# Patient Record
Sex: Female | Born: 1980 | Race: White | Hispanic: No | Marital: Married
Health system: Midwestern US, Community
[De-identification: ages and names within clinical notes are randomized; demographics above are authoritative.]

## PROBLEM LIST (undated history)

## (undated) DIAGNOSIS — F419 Anxiety disorder, unspecified: Secondary | ICD-10-CM

## (undated) DIAGNOSIS — D649 Anemia, unspecified: Secondary | ICD-10-CM

## (undated) DIAGNOSIS — B2790 Infectious mononucleosis, unspecified without complication: Secondary | ICD-10-CM

## (undated) DIAGNOSIS — K769 Liver disease, unspecified: Secondary | ICD-10-CM

## (undated) HISTORY — DX: Anemia, unspecified: D64.9

## (undated) HISTORY — PX: BREAST BIOPSY: SHX20

## (undated) HISTORY — DX: Infectious mononucleosis, unspecified without complication: B27.90

## (undated) HISTORY — DX: Anxiety disorder, unspecified: F41.9

---

## 2007-04-22 LAB — TSH 3RD GENERATION: TSH, External: 1.404

## 2007-04-22 LAB — HEP B SURFACE AG: HBsAg, External: NEGATIVE

## 2007-04-22 LAB — RUBELLA AB, IGM: Rubella, External: IMMUNE

## 2007-04-22 LAB — HEMOGLOBIN: Hgb, External: 10.8

## 2007-04-22 LAB — N GONORRHOEAE, DNA PROBE: Gonorrhea, External: NEGATIVE

## 2007-04-22 LAB — CHLAMYDIA DNA PROBE: Chlamydia, External: NEGATIVE

## 2007-04-22 LAB — RPR
RPR, EXTERNAL: NONREACTIVE
RPR, External: NONREACTIVE

## 2007-04-22 LAB — URINALYSIS W/O MICRO: Urine, External: NEGATIVE

## 2007-04-22 LAB — ANTIBODY SCREEN: Antibody screen, External: NEGATIVE

## 2007-04-22 LAB — BLOOD TYPE, (ABO+RH)
ABO,Rh: O POS
TYPE, ABO & RH, EXTERNAL: O POS

## 2007-04-22 LAB — HIV 1 WESTERN BLOT
HIV, EXTERNAL: NONREACTIVE
HIV, External: NONREACTIVE

## 2007-04-22 LAB — CYSTIC FIBROSIS MUTATIONS
CYSTIC FIBROSIS, EXTERNAL: NEGATIVE
Cystic Fibrosis, External: NEGATIVE

## 2007-04-22 LAB — HEMATOCRIT: Hct, External: 31.1

## 2007-09-02 LAB — HEMATOCRIT: Hct, External: 34.2

## 2007-09-02 LAB — HEMOGLOBIN: Hgb, External: 11.9

## 2007-09-02 LAB — GLUCOSE, RANDOM: Glucose: 146

## 2007-10-26 LAB — GYN RAPID GP B STREP: GrBStrep, External: NEGATIVE

## 2007-11-17 NOTE — Progress Notes (Signed)
Pt here co abd pain and leaking fluid nitrazine neg x 2

## 2007-11-17 NOTE — Progress Notes (Signed)
nitrazine neg after 30 min of walking ve done 1 cm posterior mild uc noted dc instructions given states understanding off efm home with family stabel

## 2007-11-18 ENCOUNTER — Inpatient Hospital Stay
Admit: 2007-11-18 | Discharge: 2007-11-20 | Disposition: A | Discharge status: Home / Self Care | Source: Home / Self Care | Attending: Obstetrics & Gynecology | Admitting: Obstetrics & Gynecology

## 2007-11-18 MED ADMIN — meperidine (DEMEROL) injection 75 mg: INTRAMUSCULAR | @ 16:00:00 | NDC 55175500101

## 2007-11-18 MED ADMIN — promethazine (PHENERGAN) injection 25 mg: INTRAVENOUS | @ 16:00:00 | NDC 60977000143

## 2007-11-18 MED ADMIN — ibuprofen (MOTRIN) tablet 400 mg: ORAL | @ 19:00:00 | NDC 76420057590

## 2007-11-18 MED ADMIN — hydrocortisone-pramoxine (EPIFOAM) 1-1 % topical foam: TOPICAL | @ 19:00:00 | NDC 68220014410

## 2007-11-18 MED ADMIN — hydrocodone-acetaminophen (LORTAB) 5-500 mg per tablet 1 Tab: ORAL | NDC 63481009170

## 2007-11-18 MED ADMIN — lidocaine (XYLOCAINE) 2 % jelly: TOPICAL | @ 16:00:00 | NDC 63323047930

## 2007-11-18 MED ADMIN — witch hazel-glycerin (TUCKS) 12.5-50 % pads 1 Each: @ 19:00:00 | NDC 50289420001

## 2007-11-18 MED ADMIN — ibuprofen (MOTRIN) tablet 400 mg: ORAL | NDC 76420057590

## 2007-11-18 MED ADMIN — hydrocodone-acetaminophen (LORTAB) 5-500 mg per tablet 1 Tab: ORAL | @ 19:00:00 | NDC 63481009170

## 2007-11-18 MED ADMIN — docusate sodium (COLACE) capsule 100 mg: ORAL | @ 19:00:00 | NDC 57664011208

## 2007-11-18 MED ADMIN — lidocaine (XYLOCAINE) 10 mg/mL (1 %) injection: @ 16:00:00 | NDC 54569319800

## 2007-11-18 MED ADMIN — oxytocin (PITOCIN) 20 units/1000 ml LR: INTRAVENOUS | @ 16:00:00 | NDC 53191040901

## 2007-11-18 NOTE — Procedures (Signed)
Delivery Note    Obstetrician:  Linden Dolin. Kylan Liberati, MD    Assistant: none    Pre-Delivery Diagnosis: Term pregnancy    Post-Delivery Diagnosis: Living newborn infant(s) or Female    Intrapartum Event: None    Procedure: Spontaneous vaginal delivery    Epidural: NO    Monitor:  Fetal Heart Tones - External and Uterine Contractions - External    Indications for instrumental delivery: none    Estimated Blood Loss: 300cc    Episiotomy: 2nd degree    Laceration(s):  n/a    Laceration(s) repair: NO    Presentation: Cephalic    Fetal Description: singleton    Fetal Position: Occiput Anterior    Birth Weight: 7 lbs 3 oz    Apgar - One Minute: 8    Apgar - Five Minutes: 9    Umbilical Cord: 3 vessels present    Complications:  none           Cord Blood Results:   Information for the patient's newborn:  Adrian Prince Boy [[id number]]   No results found for this basename: PCTABR,PCTDIG,BILI       Prenatal Labs:   Lab Results   Component Value Date/Time   ??? ABO,Rh O positive 04/22/07   ??? Antibody screen negative 04/22/07   ??? HBsAg negative 04/22/07   ??? HIV non reactive 04/22/07   ??? Rubella immune 04/22/07   ??? RPR non reactive 04/22/07   ??? Gonorrhea negative 04/22/07   ??? Chlamydia negative 04/22/07   ??? GrBS negative 10/26/07          Attending Attestation: I was present and scrubbed for the entire procedure    Signed By:  Linden Dolin. Xareni Kelch, MD     November 18, 2007

## 2007-11-18 NOTE — H&P (Signed)
Pregnancy normal per patient history.  Beta strep negative.  Previous delivery following uncomplicated pregnancy five years ago.  Ruptured at 9 am this morning.  Transported to [facility] via EMS.  Completely dilated on admission and plus one station.  Fetal heart tones reactive.  Patient proceeded to second stage as described in delivery note.

## 2007-11-18 NOTE — Progress Notes (Signed)
Pt resting in bed. Encouraged to call with needs.

## 2007-11-18 NOTE — Progress Notes (Signed)
Dr Farrell Ours at bedside.  Strip reviewed.  SVE c/100/+2  Orders to set up for delivery and start pushing.  Report received and care assumed.

## 2007-11-18 NOTE — Progress Notes (Signed)
TRANSFER - OUT REPORT:    Verbal report given to S. Balliew, RN(name) on Mary Hampton  being transferred to MIU(unit) for routine progression of care       Report consisted of patient???s Situation, Background, Assessment and   Recommendations(SBAR).     Information from the following report(s) Kardex, Procedure Summary, Intake/Output and MAR was reveiwed with the receiving nurse.    Opportunity for questions and clarification was provided.

## 2007-11-18 NOTE — Progress Notes (Signed)
SVD Viable female apgars 8-9 wt 8-3

## 2007-11-18 NOTE — Progress Notes (Signed)
Pt set up for delivery

## 2007-11-18 NOTE — Progress Notes (Signed)
Pt up to bathroom with assist from nurse. Peri care taught. Pt verbalized understanding. Pt voided large amount. Encouraged to call with needs.

## 2007-11-18 NOTE — Progress Notes (Signed)
TRANSFER - IN REPORT:    Verbal report received from Jerelene Redden, RN on Mary Hampton  being received from LDR(unit) for routine progression of care      Report consisted of patient???s Situation, Background, Assessment and   Recommendations(SBAR).     Information from the following report(s) Kardex was reveiwed with the receiving nurse.    Opportunity for questions and clarification was provided.      Assessment completed upon patient???s arrival to unit and care assumed.

## 2007-11-18 NOTE — Procedures (Signed)
Delivery Note    Obstetrician:  Ahmira Boisselle A. Janautica Netzley, MD    Assistant: none    Pre-Delivery Diagnosis: Term pregnancy    Post-Delivery Diagnosis: Living newborn infant(s) or Female    Intrapartum Event: None    Procedure: Spontaneous vaginal delivery    Epidural: NO    Monitor:  Fetal Heart Tones - External and Uterine Contractions - External    Indications for instrumental delivery: none    Estimated Blood Loss: 300cc    Episiotomy: 2nd degree    Laceration(s):  n/a    Laceration(s) repair: NO    Presentation: Cephalic    Fetal Description: singleton    Fetal Position: Occiput Anterior    Birth Weight: 7 lbs 3 oz    Apgar - One Minute: 8    Apgar - Five Minutes: 9    Umbilical Cord: 3 vessels present    Complications:  none           Cord Blood Results:   Information for the patient's newborn:  Byerly, Baby Boy [[id number]]   No results found for this basename: PCTABR,PCTDIG,BILI       Prenatal Labs:   Lab Results   Component Value Date/Time   ??? ABO,Rh O positive 04/22/07   ??? Antibody screen negative 04/22/07   ??? HBsAg negative 04/22/07   ??? HIV non reactive 04/22/07   ??? Rubella immune 04/22/07   ??? RPR non reactive 04/22/07   ??? Gonorrhea negative 04/22/07   ??? Chlamydia negative 04/22/07   ??? GrBS negative 10/26/07          Attending Attestation: I was present and scrubbed for the entire procedure    Signed By:  Mariateresa Batra A. Deanthony Maull, MD     November 18, 2007

## 2007-11-19 MED ADMIN — ibuprofen (MOTRIN) tablet 400 mg: ORAL | @ 21:00:00 | NDC 76420057590

## 2007-11-19 MED ADMIN — ibuprofen (MOTRIN) tablet 400 mg: ORAL | @ 16:00:00 | NDC 76420057590

## 2007-11-19 MED ADMIN — docusate sodium (COLACE) capsule 100 mg: ORAL | @ 12:00:00 | NDC 57664011208

## 2007-11-19 MED ADMIN — hydrocodone-acetaminophen (LORTAB) 5-500 mg per tablet 1 Tab: ORAL | @ 08:00:00 | NDC 63481009170

## 2007-11-19 MED ADMIN — hydrocodone-acetaminophen (LORTAB) 5-500 mg per tablet 1 Tab: ORAL | @ 04:00:00 | NDC 63481009170

## 2007-11-19 MED ADMIN — hydrocodone-acetaminophen (LORTAB) 5-500 mg per tablet 1 Tab: ORAL | @ 16:00:00 | NDC 63481009170

## 2007-11-19 MED ADMIN — docusate sodium (COLACE) capsule 100 mg: ORAL | @ 21:00:00 | NDC 57664011208

## 2007-11-19 MED ADMIN — ibuprofen (MOTRIN) tablet 400 mg: ORAL | @ 12:00:00 | NDC 76420057590

## 2007-11-19 MED ADMIN — hydrocortisone-pramoxine (EPIFOAM) 1-1 % topical foam: TOPICAL | @ 09:00:00 | NDC 68220014410

## 2007-11-19 MED ADMIN — ibuprofen (MOTRIN) tablet 400 mg: ORAL | @ 08:00:00 | NDC 76420057590

## 2007-11-19 MED ADMIN — hydrocodone-acetaminophen (LORTAB) 5-500 mg per tablet 1 Tab: ORAL | @ 21:00:00 | NDC 63481009170

## 2007-11-19 MED ADMIN — witch hazel-glycerin (TUCKS) 12.5-50 % pads 1 Each: @ 21:00:00 | NDC 50289420001

## 2007-11-19 MED ADMIN — hydrocodone-acetaminophen (LORTAB) 5-500 mg per tablet 1 Tab: ORAL | @ 12:00:00 | NDC 63481009170

## 2007-11-19 MED ADMIN — ibuprofen (MOTRIN) tablet 400 mg: ORAL | @ 04:00:00 | NDC 76420057590

## 2007-11-19 NOTE — Progress Notes (Signed)
Baby did not latch onto right breast, just squirmed and fussed. Mom switched to right side and baby nursed for 10 minutes well, good swallows noted. Mom pumped for stimulation. Grandmother put pacifier in baby's mouth. After baby took the 1 ml, he still acted hungry. Baby then took 18 ml's from SNS at right breast. Feeding plan given.

## 2007-11-19 NOTE — Progress Notes (Signed)
Report received from [facility]; assumed pt. Cared. Bed-side report attempted, pt. Sleeping.

## 2007-11-20 MED ADMIN — benzocaine-lanolin-aloe vera (DERMOPLAST/ALOE/LANOLIN) 20 % topical aerosol 1 Spray: TOPICAL | @ 13:00:00 | NDC 72934540402

## 2007-11-20 MED ADMIN — hydrocodone-acetaminophen (LORTAB) 5-500 mg per tablet 1 Tab: ORAL | @ 10:00:00 | NDC 63481009170

## 2007-11-20 MED ADMIN — ibuprofen (MOTRIN) tablet 400 mg: ORAL | @ 10:00:00 | NDC 76420057590

## 2007-11-20 MED ADMIN — hydrocodone-acetaminophen (LORTAB) 5-500 mg per tablet 1 Tab: ORAL | @ 15:00:00 | NDC 63481009170

## 2007-11-20 MED ADMIN — ibuprofen (MOTRIN) tablet 400 mg: ORAL | @ 15:00:00 | NDC 76420057590

## 2007-11-20 MED ADMIN — ibuprofen (MOTRIN) tablet 400 mg: ORAL | @ 01:00:00 | NDC 76420057590

## 2007-11-20 MED ADMIN — hydrocodone-acetaminophen (LORTAB) 5-500 mg per tablet 1 Tab: ORAL | @ 21:00:00 | NDC 63481009170

## 2007-11-20 MED ADMIN — docusate sodium (COLACE) capsule 100 mg: ORAL | @ 13:00:00 | NDC 57664011208

## 2007-11-20 MED ADMIN — hydrocodone-acetaminophen (LORTAB) 5-500 mg per tablet 1 Tab: ORAL | @ 01:00:00 | NDC 63481009170

## 2007-11-20 MED ADMIN — ibuprofen (MOTRIN) tablet 400 mg: ORAL | @ 21:00:00 | NDC 76420057590

## 2007-11-20 NOTE — Progress Notes (Signed)
Massage Therapy Note   Stopped by to check on giving massage.  Mother nursing, requested return in 30 min.    Kristin Bruins, LMBT

## 2007-11-20 NOTE — H&P (Addendum)
Note for Neema Barreira on 04/22/2007 - Chart 8336      Chief Complaint: This 27 year old female presents today for an initial obstetrical examination. LMP 02/13/07. Had problems with bleeding after intercourse approx 2 weeks ago. EDC by [place] 11/22/07. Associated signs and symptoms include abdominal pain in lower abdomen with coughing last night.    LMP: 04/22/2007 EDD: 11/21/2007 GW: 9 weeks, 4 days.   Obstetrical History: Apolinar Junes 08/07/1999 Method: vaginal Labor: 20 hours Duration: 38 weeks Gender: female Weight: 6 lbs. 11 ozs. Birth Status: alive Circumcised: yes Complications: hypertension       Allergies: Patient admits allergies to none known.    Medication History: Active: prenatal vitamins (OTC) (active).    Past Medical History: Past medical history is unremarkable.    Past Surgical History: Patient admits past surgical history of abortion, nasal surgery in 2001.    Social History: Patient denies alcohol use, Patient denies illegal drug use, Patient denies STD history, Patient denies tobacco use, Patient denies caffeine use, Patient denies exercising regularly.    Family History: Unremarkable.    Review of Systems: Unremarkable with exception of chief complaint.     Physical Exam: BP Sitting: 102/60 Height: 5 ft. 5.000 in. Weight: 153 lbs. BMI: 25   Patient is a 27 year old female who appears pleasant, in no apparent distress, her given age, well developed, well nourished and with good attention to hygiene and body habitus. Oriented to person, place and time. Mood and affect normal and appropriate to situation.    HEENT:Head & Face: Examination of head and face is unremarkable  Skin: No skin rash, subcutaneous nodules, lesions or ulcers observed.   No edema observed.   Cardiovascular: Heart auscultation reveals no murmurs, gallop, rubs or clicks.   Respiratory: Lungs CTA.   Breast: Chest (Breasts): Breast inspection and palpation shows no abnormal findings   Abdomen: Abdomen soft, nontender, bowel sounds present x 4 without palpable masses.   Genitourinary: External genitalia are normal in appearance.   Examination of urethra shows no abnormallities.   Examination of vaginal vault reveals no abnormalities.   Cervix shows no pathology.   Uterine portion of bimanual exam reveals contour normal, shape regular and size normal. Adnexa and parametria show no masses, tenderness, organomegaly or nodularity.   Examination of anus and perineum shows no abnormalities.     Test Results:     Ultrasound Exam: Type Of Ultrasound: OB Transvaginal.  Fetal number is 1. Fetal activity noted:  cardiac activity and body movement.  Report and images in ViewPoint.  U/S performed by [facility].      Impression: normal subsequent.      Plan: Pap smear submitted for manual screening.    Patient to follow-up as scheduled. Pregnancy schedule of monthly/bimonthly/weekly.       Prescriptions:  Rx: CitraNatal Assure- vitamin+300 mg DHA vitamin and capsule , 1 once a day. Max daily dose: 1.  Dispense: 90 of each pill . Refills: 3.  Allow Generic: Yes     Signed by _______________________________ Suszanne Conners. Mikey Bussing, M.D.  Digital Signature on 04/22/2007 at 12:55:43 PM by: Suszanne Conners. Mikey Bussing, M.D.Suszanne Conners. Mikey Bussing, MD  11/20/2007  7:35 AM There have been no significant changes since the patient's last prenatal visit. Suszanne Conners. Mikey Bussing, MD  11/20/2007  11:29 PM

## 2007-11-20 NOTE — Undefined (Incomplete)
Post-Partum Day Number 2 Progress/Discharge Note    Patient doing well post-partum and today complains of abdominal pain.  Voiding without difficulty, normal lochia, positive flatus.    Vitals:  Patient Vitals in the past 8 hrs:   BP Temp Temp src Pulse Resp SpO2 Height Wt - Scale   11/20/07 0830 114/64 mmHg 97.5 ??F (36.4 ??C) - 74  18  - - -       Temp (24hrs), Avg:97.5 ??F (36.4 ??C), Min:97.5 ??F (36.4 ??C), Max:97.5 ??F (36.4 ??C)      Vital signs stable, afebrile.    Exam:  Patient without distress.               Abdomen soft, fundus firm at level of umbilicus, nontender               Perineum with normal lochia noted.               Lower extremities are negative for swelling, cords or tenderness.    Labs: No results found for this or any previous visit (from the past 24 hour(s)).    Assessment and Plan:  Patient appears to be having uncomplicated post-partum course. Orderedd  Continue routine perineal care and maternal education.  Plan discharge for today with follow up in our office in 6 weeks.

## 2007-11-20 NOTE — Progress Notes (Signed)
Mom states baby has continued to BF well, has been using the SNS for most fds and pumping. Enc mom to continue to use SNS as needed but to pump as well when utilizing SNS. Mom states understanding. Mom rented pump for d/c. Reviewed engorgement precautions, handout given. Enc freq fdg and watch output closely.

## 2007-11-20 NOTE — Progress Notes (Signed)
md notified verbal orders received to d/c home.  Instructed by Dr. Farrell Ours to get number to pharmacy and she would call into pharmacy

## 2007-11-20 NOTE — Progress Notes (Signed)
Note for Mary Hampton on 11/11/2007 - Chart 8336      Subjective: This 27 year old female presents today for a prenatal follow-up.  The patient indicates fetal activity is present. LMP: 04/22/2007 EDD: 11/21/2007 GW: 38 weeks, 4 days. Review of Systems: Musculoskeletal: (+) joint or musculoskeletal symptoms, Allergic / Immunologic: (-) allergic or immunologic symptoms, Cardiovascular: (-) cardiovascular problems or chest symptoms, Constitutional Symptoms: (-) constitutional symptoms such as fever, headache, nausea, dizziness, Ears, Nose, Mouth, Throat: (-) symptoms involving ear, nose, mouth, or throat, Endocrine: (-) endocrine-related symptoms, Eyes: (-) eye or vision problems, Gastrointestinal: (-) GI symptoms, Genitourinary: (-) GU symptoms, Hematologic / Lymphatic: (-) bleeding problems, Integumentary: (-) hypersensitivity of skin, Neurological: (-) neurological symptoms or problems, Psychiatric: (-) psychiatric or emotional difficulties, Respiratory: (-) breathing difficulties, respiratory symptoms.      Objective: Patient is a 27 year old female who appears pleasant, in no apparent distress, her given age, well developed, well nourished and with good attention to hygiene and body habitus.      Test Results:  Prenatal Visit - Glucosuria: negative; Proteinuria: trace; Edema: absent; Mvmt: present; FHR: 149 bpm ; Hb: NT g/dl ; Wt: 187.25 lbs ; FHt: 38 cm ; BP: 108/70; Remarks: C/o swelling in feet. Pt [facility] different [facility] at [facility]. ;      Assessment: Patient is pregnant and Urine Dip Complete.      Plan: Return for previously scheduled appointment..       Signed by _______________________________ Suszanne Conners. Mikey Bussing, M.D.  Digital Signature on 11/11/2007 at 1:22:57 PM by: Suszanne Conners. Mikey Bussing, M.D.

## 2007-11-20 NOTE — Progress Notes (Signed)
[facility]      [address] [place], [place] [address]  [phone]    MASSAGE THERAPY NOTE  []   Initial Assessment    []   Daily Note    NAME/AGE/GENDER: Particia Strahm is a 27 y.o. female  DATE: 11/20/2007 10:29 AM    Primary Diagnosis:  <principal problem not specified> * No surgery found *    Referring Provider: Mikey Bussing    PRIOR LEVEL OF FUNCTION: Healthy    No past medical history on file.  No past surgical history on file.    COGNITIVE / COMMUNICATION STATUS:  fully communicative    SUBJECTIVE:    Medical History Related to Therapy: Delivered baby    Patient reports: Arm/back pain    Pain/Discomfort level:   [] 0 [] 1 [] 2 [] 3 [] 4 [] 5 [] 6 [x] 7 [] 8 [] 9 [] 10      Location: arms/shoulders/back, on bed in room    OBJECTIVE:    Significant findings: Tight shoulders, LS, Traps, biceps, triceps.  Pt expressed "ouch" when pressure applied to low back, right side    Treatment Provided:  [x] Soft tissue massage [] Deep tissue massage [] Trigger point release   [] Light Touch Massage  [] Other:     Location: back, arms, neck shoulders    Patient position: seated on edge of bed    Time: 15 minutes    Other information/comments/patient education: Pt had a deep tissue massage prior. Didn't like it    RECOMMENDATIONS / TREATMENT PLAN:    ASSESSMENT:  Response to treatment: reported no pain in arms after massage    Pain/Discomfort level:  [x] 0 [] 1[]  2 [] 3 [] 4[]  5 [] 6 [] 7[]  8[]  9 [] 10        Location: arms, shoulders, neck, back    PLAN/INTENT FOR NEXT TREATMENT:  Order is for a one-time visit only.  No further therapy is planned.    Kristin Bruins, LMBT

## 2007-11-20 NOTE — Progress Notes (Signed)
Discharge teachng complete.Pt verbalized understanding, questions encouraged..  Pt. Stable and discharged to home per MD order. Pt. Left unit via w/c with family , newborn in carseat the patient. And newborn escorted off unit by MIU staff. Pt. To home via private automobile.

## 2007-11-20 NOTE — Progress Notes (Signed)
Note for Mary Hampton on 10/22/2007 - Chart 8336      This 27 year old female presents today for an Ultrasound to follow up position.   Type of diagnostic ultrasound performed: limited, for position.  Type Of Ultrasound: OB Ltd. (Position, Plac Location or heart tones).  Reasons for OB ultrasound: Position only.     Ultrasounds -   Gestational Age by Ultrasound: ---- weeks  Gestational Age by Date: 36 weeks  Remarks: AFI is within normal limits Vertex position     Limited scan for position only.  Vertex position.  AFI 10.6 cm.  Good movements detected.      Reason for OB Ultrasound:  Breech.      Ultrasound performed by Everlean Alstrom, RDMS.   Report and Images in Viewpoint.     Signed by _______________________________ Suszanne Conners. Mikey Bussing, M.D.

## 2007-11-20 NOTE — Progress Notes (Signed)
Note for Mary Hampton on 07/06/2007 - Chart 8336      This 27 year old female presents today for a fetal ultrasound.  Type of diagnostic ultrasound performed: Ultrasound,pregnant uterus,detailed fetal anatomic exam,transabdominal,singleton.   Reason for OB Ultrasound:  Screening for malformation.      Ultrasounds -   Gestational Age by Ultrasound: [redacted]w[redacted]d weeks  Gestational Age by Date: 20 weeks  Remarks: Agrees with EDD Appropriate interval growth boy Do not tell sex of baby!     Anatomy and growth are appropriate for this gestional age.   No abnormalities are seen at this time.  Do not tell sex of baby!     Reason for OB Ultrasound:  Screening for malformation.        Ultrasound performed by Everlean Alstrom, RDMS.  Report and images in ViewPoint.           Signed by _______________________________ Suszanne Conners. Mikey Bussing, M.D.

## 2015-08-21 ENCOUNTER — Encounter

## 2015-09-01 ENCOUNTER — Inpatient Hospital Stay: Admit: 2015-09-01 | Payer: PRIVATE HEALTH INSURANCE | Attending: Family | Primary: Family Medicine

## 2015-09-01 ENCOUNTER — Inpatient Hospital Stay
Admit: 2015-09-01 | Discharge: 2015-09-01 | Disposition: A | Discharge status: Home / Self Care | Payer: PRIVATE HEALTH INSURANCE | Attending: Emergency Medicine

## 2015-09-01 DIAGNOSIS — T7840XA Allergy, unspecified, initial encounter: Secondary | ICD-10-CM

## 2015-09-01 DIAGNOSIS — K769 Liver disease, unspecified: Secondary | ICD-10-CM

## 2015-09-01 MED ORDER — FAMOTIDINE (PF) 20 MG/2 ML IV
20 mg/2 mL | INTRAVENOUS | Status: AC
Start: 2015-09-01 — End: 2015-09-01
  Administered 2015-09-01: 13:00:00 via INTRAVENOUS

## 2015-09-01 MED ORDER — DIPHENHYDRAMINE HCL 50 MG/ML IJ SOLN
50 mg/mL | INTRAMUSCULAR | Status: AC
Start: 2015-09-01 — End: 2015-09-01
  Administered 2015-09-01: 13:00:00 via INTRAVENOUS

## 2015-09-01 MED ORDER — SODIUM CHLORIDE 0.9% BOLUS IV
0.9 % | Freq: Once | INTRAVENOUS | Status: AC
Start: 2015-09-01 — End: 2015-09-01
  Administered 2015-09-01: 13:00:00 via INTRAVENOUS

## 2015-09-01 MED ORDER — EPINEPHRINE 0.3 MG/0.3 ML IM PEN INJECTOR
0.3 mg/ mL | INJECTION | Freq: Once | INTRAMUSCULAR | 0 refills | Status: AC | PRN
Start: 2015-09-01 — End: ?

## 2015-09-01 MED ORDER — METHYLPREDNISOLONE (PF) 125 MG/2 ML IJ SOLR
125 mg/2 mL | Freq: Once | INTRAMUSCULAR | Status: AC
Start: 2015-09-01 — End: 2015-09-01
  Administered 2015-09-01: 13:00:00 via INTRAVENOUS

## 2015-09-01 MED ORDER — SALINE PERIPHERAL FLUSH PRN
Freq: Once | INTRAMUSCULAR | Status: AC
Start: 2015-09-01 — End: 2015-09-01
  Administered 2015-09-01: 13:00:00

## 2015-09-01 MED ORDER — PREDNISONE 20 MG TAB
20 mg | ORAL_TABLET | Freq: Every day | ORAL | 0 refills | Status: AC
Start: 2015-09-01 — End: 2015-09-05

## 2015-09-01 MED ORDER — GADOBENATE DIMEGLUMINE 529 MG/ML (0.1 MMOL/0.2 ML) IV
529 mg/mL (0.1mmol/0.2mL) | Freq: Once | INTRAVENOUS | Status: AC
Start: 2015-09-01 — End: 2015-09-01
  Administered 2015-09-01: 13:00:00 via INTRAVENOUS

## 2015-09-01 MED FILL — DIPHENHYDRAMINE HCL 50 MG/ML IJ SOLN: 50 mg/mL | INTRAMUSCULAR | Qty: 1

## 2015-09-01 MED FILL — FAMOTIDINE (PF) 20 MG/2 ML IV: 20 mg/2 mL | INTRAVENOUS | Qty: 2

## 2015-09-01 MED FILL — SOLU-MEDROL (PF) 125 MG/2 ML SOLUTION FOR INJECTION: 125 mg/2 mL | INTRAMUSCULAR | Qty: 2

## 2015-09-01 NOTE — ED Provider Notes (Addendum)
HPI Comments: 29 female here with allergic reaction for outpatient MRI. Symptoms started approximately 15-20 minutes ago.  She received IV contrast and shortly after started to feel itchiness in her back of throat and coughing.  She has had improvement from the cough and standpoint however still had some itchiness in her neck or throat.  Denies any rashes, lips, tongue swelling.  Denies any abdominal pain, nausea.  When she stood up she felt some slight lightheadedness.  Prior to the MRI she was feeling fine has no symptoms or concern    Patient is a 35 y.o. female presenting with allergic reaction. The history is provided by the patient.   Allergic Reaction    Pertinent negatives include no shortness of breath.        History reviewed. No pertinent past medical history.    History reviewed. No pertinent surgical history.      History reviewed. No pertinent family history.    Social History     Social History   ??? Marital status: MARRIED     Spouse name: N/A   ??? Number of children: N/A   ??? Years of education: N/A     Occupational History   ??? Not on file.     Social History Main Topics   ??? Smoking status: Never Smoker   ??? Smokeless tobacco: Not on file   ??? Alcohol use No   ??? Drug use: No   ??? Sexual activity: Yes     Other Topics Concern   ??? Not on file     Social History Narrative         ALLERGIES: Contrast agent [iodine] and Multihance [gadobenate dimeglumine]    Review of Systems   Constitutional: Negative.    HENT: Negative for congestion, rhinorrhea and sore throat.    Eyes: Negative.    Respiratory: Negative.  Negative for cough, chest tightness and shortness of breath.    Cardiovascular: Negative.    Gastrointestinal: Negative.    Genitourinary: Negative.    Musculoskeletal: Negative.    Neurological: Negative.    Psychiatric/Behavioral: Negative.        Vitals:    09/01/15 0849   BP: 114/51   Pulse: 65   Resp: 18   Temp: 98.3 ??F (36.8 ??C)   SpO2: 100%   Weight: 73.5 kg (162 lb)   Height: 5' 4.5" (1.638 m)             Physical Exam   Constitutional: She is oriented to person, place, and time. She appears well-developed and well-nourished. No distress.   HENT:   Head: Normocephalic and atraumatic.   Nose: Nose normal.   Mouth/Throat: Oropharynx is clear and moist.   No lives edema, lingual, sublingual swelling.  No posterior pharynx edema   Eyes: Conjunctivae and EOM are normal. Pupils are equal, round, and reactive to light.   Neck: Normal range of motion. Neck supple.   Cardiovascular: Normal rate, regular rhythm, normal heart sounds and intact distal pulses.    Pulmonary/Chest: Effort normal and breath sounds normal. No respiratory distress. She has no wheezes. She has no rales. She exhibits no tenderness.   Abdominal: Soft. Bowel sounds are normal. She exhibits no distension. There is no tenderness. There is no rebound.   Musculoskeletal: Normal range of motion. She exhibits no edema or deformity.   Neurological: She is alert and oriented to person, place, and time.   Skin: Skin is warm and dry. She is not diaphoretic.  MDM  Number of Diagnoses or Management Options  Diagnosis management comments: Clinically no rash no signs of any airway compromise however she still has some complaint of mild itchiness in the back of throat.  We'll initiate treatment for allergic reaction with Solu-Medrol, Pepcid, Benadryl.  We'll also give IV fluid.  Symptoms started at approximately 8:15 am. Attempts are 2 patient for at least one hour until approximately 10 AM and reassessed.    Spoke with MRI tech - Contrast at approximately [phone]. Patient started coughing and complained of nausea. She felt better shortly after and wanted to finish the MRI. They finished. Patient felt better but still has coughing and itchiness in the back of her throat so he brought her to the ED for evaluation.         ED Course   [phone] - Patient is feeling well. Speaking without difficulty.   Will reassess      1020 - Feels better.   Itchiness in back of throat mostly resolved.  Lungs are clear. No wheezing. No stridors.  No posterior pharynx edema. We'll give prescription for Adrenaclick and prednisone for 4 additional days. Benadryl and pepcid at home. Return precautions given. No further questions or concerns     Dragon voice recognition software was used to create this note. Although the note has been reviewed and corrected where necessary, additional errors may have been overlooked and remain in the text.  Procedures

## 2015-09-01 NOTE — ED Notes (Signed)
Patient given ice chips and water.

## 2015-09-01 NOTE — ED Notes (Signed)
Pt discharged home with rx x2 (EpiPen and prednisone) and instructions on when to call for help, when to use epipen and who to follow up with. Verbal understanding of all instructions by patient. Questions answered prior to discharge. All belongings taken with pt. Pt ambulatory out of department w/o difficulty. Accompanied home by son. Pt states she is not dizzy and is able to drive.

## 2015-09-01 NOTE — ED Triage Notes (Signed)
Patient came form MRI after having nausea and congestion with the IV contrast. Patient feels like her throat is scratchy.

## 2018-12-22 ENCOUNTER — Encounter: Payer: Self-pay | Admitting: Gastroenterology

## 2019-01-11 ENCOUNTER — Ambulatory Visit: Payer: Self-pay | Admitting: Gastroenterology

## 2019-02-04 ENCOUNTER — Other Ambulatory Visit: Payer: Self-pay

## 2019-02-04 ENCOUNTER — Encounter: Payer: Self-pay | Admitting: Gastroenterology

## 2019-02-04 ENCOUNTER — Ambulatory Visit: Payer: Medicaid Other | Admitting: Gastroenterology

## 2019-02-04 VITALS — BP 106/74 | HR 70 | Temp 98.6°F | Ht 65.0 in | Wt 180.5 lb

## 2019-02-04 DIAGNOSIS — K219 Gastro-esophageal reflux disease without esophagitis: Secondary | ICD-10-CM

## 2019-02-04 DIAGNOSIS — K449 Diaphragmatic hernia without obstruction or gangrene: Secondary | ICD-10-CM

## 2019-02-04 DIAGNOSIS — R1011 Right upper quadrant pain: Secondary | ICD-10-CM

## 2019-02-04 MED ORDER — PANTOPRAZOLE SODIUM 40 MG PO TBEC: 40.0000 mg | DELAYED_RELEASE_TABLET | Freq: Every day | ORAL | 0 refills | Status: DC

## 2019-02-04 NOTE — Patient Instructions (Signed)
If you are age 40 or older, your body mass index should be between 23-30. Your Body mass index is 30.04 kg/m. If this is out of the aforementioned range listed, please consider follow up with your Primary Care Provider.  If you are age 46 or younger, your body mass index should be between 19-25. Your Body mass index is 30.04 kg/m. If this is out of the aformentioned range listed, please consider follow up with your Primary Care Provider.   You have been scheduled for an abdominal ultrasound at [facility] (1st floor) on 02/10/19 at 9am. Please arrive 15 minutes prior to your appointment for registration. Make certain not to have anything to eat or drink 6 hours prior to your appointment. Should you need to reschedule your appointment, please contact radiology at [phone]. This test typically takes about 30 minutes to perform.  Please go to the lab at [facility] ([address] [place] You will need to go to level "B", you do not need an appointment for this. Hours available are 7:30 am - 4:30 pm.   Please call Dr. Donne Hazel nurse Veronia Beets, RN)  in 2 weeks at [phone]  to let her now how you are doing.   Avoid NSAIDS  Follow up 12 weeks.   Thank you,  Dr. Lynann Bologna

## 2019-02-04 NOTE — Progress Notes (Signed)
Chief Complaint: Right upper quad abdominal pain.  Referring Provider:  No ref. provider found      ASSESSMENT AND PLAN;   #1. RUQ Pain. Epigastric pain. D/d PUD, GERD, gastritis, nonulcer dyspepsia, gastroparesis, musculoskeletal etiology, r/o biliary or pancreatic problems.  #2. GERD with HH  #3. Small right liver hemangioma.  Plan: - [place] complete - CBC, CMP, lipase, celiac and TSH - Trial of protonix 40mg  po qd x 2 weeks. - Call in 2 weeks - If still with problems, consider EGD. - Avoid NSAIDS - FU in 12 weeks.  HPI:    Aimee Escobar is a 39 y.o. female  RUQ pain x 2 months, has nausea but no vomiting.  Does have heartburn.  No odynophagia or dysphagia.  Describes this pain as more or less sharp, nonradiating, occasionally exacerbated after eating.  Could not identify any definite foods which make it worse.  Occasional chest pains.  No weight loss, melena or hematochezia.  occ constipation, better with increased water intake.  No jaundice dark urine or pale stools.  Has previously been diagnosed as having "hiatal hernia".  I could not find any documentation.  She never had EGD done as she is petrified regarding anesthesia.  No sodas, chocolates, chewing gums, artificial sweeteners and candy. No NSAIDs  Past GI history and work-up: [facility] 2017 -Had right upper quad abdominal pain 2017. 2018 showed 10 mm lesion in the liver. CT 06/2015 showed 8 mm lesion in the right lobe of the liver.  Subsequent MRI 09/01/2015 showed small hemangioma.  Patient did get sick after IV MRI  contrast.  But no skin rash/shortness of breath.  HIDA with EF: 35% ejection fraction.  Had normal CBC, CMP, TSH 2017.  Offered EGD but refused.  SH: Separated, 2 boys, no alcohol. Past Medical History:  Diagnosis Date  . Anemia   . Anxiety   . 2018 virus infection   . Mononucleosis      Family History  Problem Relation Age of Onset  . Pancreatic cancer Father   .  Colon cancer Neg Hx   . Esophageal cancer Neg Hx     Social History   Tobacco Use  . Smoking status: Former Malachi Carl  . Smokeless tobacco: Never Used  Substance Use Topics  . Alcohol use: Not Currently  . Drug use: Not Currently    Current Outpatient Medications  Medication Sig Dispense Refill  . Ascorbic Acid (VITAMIN C PO) Take by mouth.    . clonazePAM (KLONOPIN) 0.5 MG tablet Take 1 tablet by mouth as needed.    . Cyanocobalamin (VITAMIN B-12 PO) Take by mouth as needed.    . Ferrous Sulfate (IRON PO) Take by mouth as needed.    Games developer MILK THISTLE PO Take 1 tablet by mouth daily.    . Multiple Vitamins-Minerals (ZINC PO) Take by mouth daily. 11 pumps daily    . Vitamin D-Vitamin K (VITAMIN K2-VITAMIN D3 PO) Take by mouth.     No current facility-administered medications for this visit.    Allergies  Allergen Reactions  . Other     Other reaction(s): Diarrhea-Intolerance Itching in throat and coughing  gadolinium contrast  . Tomato     Can do Raw but not cooked Said she will itch all over and get gas    Review of Systems:  Constitutional: Denies fever, chills, diaphoresis, appetite change and fatigue.  HEENT: Had allergies and sinus problems. Respiratory: Denies SOB, DOE, cough, chest tightness,  and wheezing.   Cardiovascular: Denies chest pain, palpitations and leg swelling.  Genitourinary: Denies dysuria, urgency, frequency, hematuria, flank pain and difficulty urinating.  Musculoskeletal: Denies myalgias, has back pain, no joint swelling, arthralgias and gait problem.  Skin: No rash.  Neurological: Denies dizziness, seizures, syncope, weakness, light-headedness, numbness and headaches.  Hematological: Denies adenopathy. Easy bruising, personal or family bleeding history  Psychiatric/Behavioral: has anxiety, no depression     Physical Exam:    BP 106/74   Pulse 70   Temp 98.6 F (37 C)   Ht 5\' 5"  (1.651 m)   Wt 180 lb 8 oz (81.9 kg)   BMI 30.04 kg/m  Wt  Readings from Last 3 Encounters:  02/04/19 180 lb 8 oz (81.9 kg)   Constitutional:  Well-developed, in no acute distress. Psychiatric: Normal mood and affect. Behavior is normal. HEENT: Wearing mask.  No scleral icterus. Neck supple.  Cardiovascular: Normal rate, regular rhythm. No edema Pulmonary/chest: Effort normal and breath sounds normal. No wheezing, rales or rhonchi. Abdominal: Soft, nondistended. Nontender. Bowel sounds active throughout. There are no masses palpable. No hepatomegaly. Seen in presence of Brook CMA. Rectal:  defered Neurological: Alert and oriented to person place and time. Skin: Skin is warm and dry. No rashes noted.    Carmell Austria, MD 02/04/2019, 10:53 AM  Cc: No ref. provider found

## 2019-02-09 ENCOUNTER — Other Ambulatory Visit (INDEPENDENT_AMBULATORY_CARE_PROVIDER_SITE_OTHER): Payer: Medicaid Other

## 2019-02-09 DIAGNOSIS — K219 Gastro-esophageal reflux disease without esophagitis: Secondary | ICD-10-CM

## 2019-02-09 DIAGNOSIS — R1011 Right upper quadrant pain: Secondary | ICD-10-CM | POA: Diagnosis not present

## 2019-02-09 DIAGNOSIS — K449 Diaphragmatic hernia without obstruction or gangrene: Secondary | ICD-10-CM | POA: Diagnosis not present

## 2019-02-09 LAB — CBC WITH DIFFERENTIAL/PLATELET
Basophils Absolute: 0 10*3/uL (ref 0.0–0.1)
Basophils Relative: 1.1 % (ref 0.0–3.0)
Eosinophils Absolute: 0.1 10*3/uL (ref 0.0–0.7)
Eosinophils Relative: 1.2 % (ref 0.0–5.0)
HCT: 37.6 % (ref 36.0–46.0)
Hemoglobin: 12.6 g/dL (ref 12.0–15.0)
Lymphocytes Relative: 35.1 % (ref 12.0–46.0)
Lymphs Abs: 1.5 10*3/uL (ref 0.7–4.0)
MCHC: 33.4 g/dL (ref 30.0–36.0)
MCV: 92.6 fl (ref 78.0–100.0)
Monocytes Absolute: 0.4 10*3/uL (ref 0.1–1.0)
Monocytes Relative: 8.2 % (ref 3.0–12.0)
Neutro Abs: 2.3 10*3/uL (ref 1.4–7.7)
Neutrophils Relative %: 54.4 % (ref 43.0–77.0)
Platelets: 301 10*3/uL (ref 150.0–400.0)
RBC: 4.06 Mil/uL (ref 3.87–5.11)
RDW: 13 % (ref 11.5–15.5)
WBC: 4.3 10*3/uL (ref 4.0–10.5)

## 2019-02-09 LAB — COMPREHENSIVE METABOLIC PANEL
ALT: 18 U/L (ref 0–35)
AST: 16 U/L (ref 0–37)
Albumin: 4.5 g/dL (ref 3.5–5.2)
Alkaline Phosphatase: 41 U/L (ref 39–117)
BUN: 12 mg/dL (ref 6–23)
CO2: 27 mEq/L (ref 19–32)
Calcium: 9.1 mg/dL (ref 8.4–10.5)
Chloride: 103 mEq/L (ref 96–112)
Creatinine, Ser: 0.8 mg/dL (ref 0.40–1.20)
GFR: 79.78 mL/min (ref >60.00)
Glucose, Bld: 86 mg/dL (ref 70–99)
Potassium: 3.9 mEq/L (ref 3.5–5.1)
Sodium: 138 mEq/L (ref 135–145)
Total Bilirubin: 0.8 mg/dL (ref 0.2–1.2)
Total Protein: 7.2 g/dL (ref 6.0–8.3)

## 2019-02-09 LAB — TSH: TSH: 1.09 u[IU]/mL (ref 0.35–4.50)

## 2019-02-09 LAB — LIPASE: Lipase: 52 U/L (ref 11.0–59.0)

## 2019-02-10 ENCOUNTER — Ambulatory Visit (HOSPITAL_BASED_OUTPATIENT_CLINIC_OR_DEPARTMENT_OTHER)
Admission: RE | Admit: 2019-02-10 | Discharge: 2019-02-10 | Disposition: A | Discharge status: Home / Self Care | Payer: Medicaid Other | Source: Ambulatory Visit | Attending: Gastroenterology | Admitting: Gastroenterology

## 2019-02-10 ENCOUNTER — Other Ambulatory Visit: Payer: Self-pay

## 2019-02-10 DIAGNOSIS — K449 Diaphragmatic hernia without obstruction or gangrene: Secondary | ICD-10-CM | POA: Diagnosis present

## 2019-02-10 DIAGNOSIS — R1011 Right upper quadrant pain: Secondary | ICD-10-CM | POA: Diagnosis present

## 2019-02-10 DIAGNOSIS — K219 Gastro-esophageal reflux disease without esophagitis: Secondary | ICD-10-CM | POA: Diagnosis present

## 2019-02-11 LAB — CELIAC PANEL 10
Antigliadin Abs, IgA: 3 units (ref 0–19)
Endomysial IgA: NEGATIVE
Gliadin IgG: 2 units (ref 0–19)
IgA/Immunoglobulin A, Serum: 122 mg/dL (ref 87–352)
Tissue Transglut Ab: <2 U/mL (ref 0–5)
Transglutaminase IgA: <2 U/mL (ref 0–3)

## 2019-02-15 ENCOUNTER — Telehealth: Payer: Self-pay | Admitting: Gastroenterology

## 2019-02-15 ENCOUNTER — Other Ambulatory Visit: Payer: Self-pay

## 2019-02-15 DIAGNOSIS — R1011 Right upper quadrant pain: Secondary | ICD-10-CM

## 2019-02-15 NOTE — Telephone Encounter (Signed)
Please review previous documentation; 

## 2019-02-26 ENCOUNTER — Ambulatory Visit (HOSPITAL_COMMUNITY): Payer: Medicaid Other

## 2019-05-19 ENCOUNTER — Emergency Department
Admission: EM | Admit: 2019-05-19 | Discharge: 2019-05-19 | Disposition: A | Discharge status: Home / Self Care | Payer: Medicaid Other | Source: Home / Self Care

## 2019-05-19 ENCOUNTER — Other Ambulatory Visit: Payer: Self-pay

## 2019-05-19 ENCOUNTER — Encounter: Payer: Self-pay | Admitting: Emergency Medicine

## 2019-05-19 ENCOUNTER — Emergency Department (INDEPENDENT_AMBULATORY_CARE_PROVIDER_SITE_OTHER): Payer: Medicaid Other

## 2019-05-19 DIAGNOSIS — R0602 Shortness of breath: Secondary | ICD-10-CM | POA: Diagnosis not present

## 2019-05-19 NOTE — ED Provider Notes (Signed)
[facility]    CSN: [id number] Arrival date & time: 05/19/19  1637      History   Chief Complaint Chief Complaint  Patient presents with  . Shortness of Breath    HPI Aimee Escobar is a 39 y.o. female.   HPI  Aimee Escobar is a 39 y.o. female presenting to UC with c/o intermittent episodes of SOB and burning in her chest.  She was seen at a different urgent care 7 days ago, started on a steroids dose pack, has completed that course but still feels SOB at times.  This kept her up at night last night.  Denies chest pain or SOB at this time but wonders if she needs an antibiotic for possible bronchitis, which she has had in the past. Denies fever, chills, cough, congestion.  She did stop smoking cigarettes about a week ago.  She does have a hx of anxiety and takes klonopin but she feels like the initial SOB is what triggers her anxiety rather than anxiety causing the SOB.  Denies sick contacts or recent travel. She is not concerned for Covid. No hx of blood clots or heart problems. Denies leg pain or swelling. She has not received the Covid-19 vaccine yet.  Pt does not have a PCP at this time. She is new to the area, moved from [place] [facility].  Past Medical History:  Diagnosis Date  . Anemia   . Anxiety   . Malachi Carl virus infection   . Mononucleosis     There are no problems to display for this patient.   History reviewed. No pertinent surgical history.  OB History   No obstetric history on file.      Home Medications    Prior to Admission medications   Medication Sig Start Date End Date Taking? Authorizing Provider  Ascorbic Acid (VITAMIN C PO) Take by mouth.    [provider]  clonazePAM (KLONOPIN) 0.5 MG tablet Take 1 tablet by mouth as needed. 08/21/15   [provider]  Cyanocobalamin (VITAMIN B-12 PO) Take by mouth as needed.    [provider]  Ferrous Sulfate (IRON PO) Take by mouth as needed.    [provider]  MILK  THISTLE PO Take 1 tablet by mouth daily.    [provider]  Multiple Vitamins-Minerals (ZINC PO) Take by mouth daily. 11 pumps daily    [provider]  pantoprazole (PROTONIX) 40 MG tablet Take 1 tablet (40 mg total) by mouth daily for 14 days. 02/04/19 02/18/19  Lynann Bologna, MD  Vitamin D-Vitamin K (VITAMIN K2-VITAMIN D3 PO) Take by mouth.    [provider]    Family History Family History  Problem Relation Age of Onset  . Pancreatic cancer Father   . Colon cancer Neg Hx   . Esophageal cancer Neg Hx     Social History Social History   Tobacco Use  . Smoking status: Former Games developer  . Smokeless tobacco: Never Used  Substance Use Topics  . Alcohol use: Not Currently  . Drug use: Not Currently     Allergies   Other and Tomato   Review of Systems Review of Systems  Constitutional: Negative for chills and fever.  HENT: Negative for congestion, ear pain, sore throat, trouble swallowing and voice change.   Respiratory: Positive for chest tightness and shortness of breath. Negative for cough.   Cardiovascular: Negative for chest pain and palpitations.  Gastrointestinal: Negative for abdominal pain, diarrhea, nausea and vomiting.  Musculoskeletal: Negative for arthralgias, back pain and myalgias.  Skin: Negative for rash.  All other systems reviewed and are negative.    Physical Exam Triage Vital Signs ED Triage Vitals  Enc Vitals Group     BP 05/19/19 1704 111/72     Pulse Rate 05/19/19 1704 72     Resp 05/19/19 1704 16     Temp 05/19/19 1704 98 F (36.7 C)     Temp Source 05/19/19 1704 Oral     SpO2 05/19/19 1704 99 %     Weight 05/19/19 1705 179 lb (81.2 kg)     Height 05/19/19 1705 5\' 5"  (1.651 m)     Head Circumference --      Peak Flow --      Pain Score 05/19/19 1704 0     Pain Loc --      Pain Edu? --      Excl. in [place]? --    No data found.  Updated Vital Signs BP 111/72 (BP Location: Right Arm)   Pulse 72   Temp 98 F  (36.7 C) (Oral)   Resp 16   Ht 5\' 5"  (1.651 m)   Wt 179 lb (81.2 kg)   LMP 04/30/2019   SpO2 99%   BMI 29.79 kg/m   Visual Acuity Right Eye Distance:   Left Eye Distance:   Bilateral Distance:    Right Eye Near:   Left Eye Near:    Bilateral Near:     Physical Exam Vitals and nursing note reviewed.  Constitutional:      General: She is not in acute distress.    Appearance: She is well-developed. She is not ill-appearing, toxic-appearing or diaphoretic.  HENT:     Head: Normocephalic and atraumatic.     Right Ear: Tympanic membrane and ear canal normal.     Left Ear: Tympanic membrane and ear canal normal.     Nose: Nose normal.     Right Sinus: No maxillary sinus tenderness or frontal sinus tenderness.     Left Sinus: No maxillary sinus tenderness or frontal sinus tenderness.     Mouth/Throat:     Lips: Pink.     Mouth: Mucous membranes are moist.     Pharynx: Oropharynx is clear. Uvula midline.  Cardiovascular:     Rate and Rhythm: Normal rate and regular rhythm.  Pulmonary:     Effort: Pulmonary effort is normal.     Breath sounds: Normal breath sounds. No decreased breath sounds, wheezing, rhonchi or rales.  Musculoskeletal:        General: Normal range of motion.     Cervical back: Normal range of motion.  Skin:    General: Skin is warm and dry.  Neurological:     Mental Status: She is alert and oriented to person, place, and time.  Psychiatric:        Behavior: Behavior normal.      UC Treatments / Results  Labs (all labs ordered are listed, but only abnormal results are displayed) Labs Reviewed - No data to display  EKG   Radiology DG Chest 2 View  Result Date: 05/19/2019 CLINICAL DATA:  Shortness of breath and chest tightness for 1 week. EXAM: CHEST - 2 VIEW COMPARISON:  None. FINDINGS: Midline trachea.  Normal heart size and mediastinal contours. Sharp costophrenic angles.  No pneumothorax.  Clear lungs. IMPRESSION: No active cardiopulmonary  disease. Electronically Signed   By: Abigail Miyamoto M.D.   On: 05/19/2019 17:53  Procedures Procedures (including critical care time)  Medications Ordered in UC Medications - No data to display  Initial Impression / Assessment and Plan / UC Course  I have reviewed the triage vital signs and the nursing notes.  Pertinent labs & imaging results that were available during my care of the patient were reviewed by me and considered in my medical decision making (see chart for details).     Reviewed medical records from UC on 05/12/19, when she was seen for same. Pt had a normal lung exam and O2 of 97% at that time.  Today, O2 Sat 99% on RA, vitals WNL Lungs: CTAB CXR: WNL Reassured pt Discussed her hx of anemia, pt states only when she has a heavy period but does not feel anemic at this time. Declined CBC. No evidence of emergent process taking place at this time.  Doubt PE or ACS.   Discussed burning sensation in chest could be due to seasonal allergies due to high pollen counts, possibly due to her lungs readjusting to her recent smoking cessation. Encouraged pt to continue the good work of not smoking. Question the roll pt's anxiety plays in the episodic SOB Encouraged to establish care with a PCP in the area now that she has moved.  Discussed symptoms that warrant emergent care in the ED. AVS provided.  Final Clinical Impressions(s) / UC Diagnoses   Final diagnoses:  Shortness of breath     Discharge Instructions      Your chest x-ray today looked normal as well as your vital signs with your oxygen being 99% and your lungs sounding clear.  It is recommended you follow up with your primary care provider for further evaluation and treatment of your recurrent episodes of shortness of breath.  Call 911 or go to the closest hospital if symptoms worsening, including worsening chest pain, trouble breathing, dizziness, passing out, or other new concerning symptoms develop.      ED Prescriptions    None     I have reviewed the PDMP during this encounter.   Lurene Shadow, [place] 05/19/19 1829

## 2019-05-19 NOTE — ED Triage Notes (Signed)
Patient was seen 7 days ago for shortness of breath; placed on step down prednisone and inhaler; had trouble sleeping last night; admittedly no distress right now. In process of trying to quit smoking with recent step back to smoking. No fever. Wonders if she has bronchitis; history of bronchitis but never pneumonia. No known contact with covid positive person.

## 2019-05-19 NOTE — Discharge Instructions (Signed)
  Your chest x-ray today looked normal as well as your vital signs with your oxygen being 99% and your lungs sounding clear.  It is recommended you follow up with your primary care provider for further evaluation and treatment of your recurrent episodes of shortness of breath.  Call 911 or go to the closest hospital if symptoms worsening, including worsening chest pain, trouble breathing, dizziness, passing out, or other new concerning symptoms develop.

## 2019-07-15 ENCOUNTER — Emergency Department (HOSPITAL_BASED_OUTPATIENT_CLINIC_OR_DEPARTMENT_OTHER)
Admission: EM | Admit: 2019-07-15 | Discharge: 2019-07-15 | Disposition: A | Discharge status: Home / Self Care | Payer: Medicaid Other | Attending: Emergency Medicine | Admitting: Emergency Medicine

## 2019-07-15 ENCOUNTER — Other Ambulatory Visit: Payer: Self-pay

## 2019-07-15 ENCOUNTER — Encounter (HOSPITAL_BASED_OUTPATIENT_CLINIC_OR_DEPARTMENT_OTHER): Payer: Self-pay | Admitting: Emergency Medicine

## 2019-07-15 ENCOUNTER — Emergency Department (HOSPITAL_BASED_OUTPATIENT_CLINIC_OR_DEPARTMENT_OTHER): Payer: Medicaid Other

## 2019-07-15 DIAGNOSIS — R1011 Right upper quadrant pain: Secondary | ICD-10-CM | POA: Diagnosis not present

## 2019-07-15 DIAGNOSIS — Z3202 Encounter for pregnancy test, result negative: Secondary | ICD-10-CM | POA: Insufficient documentation

## 2019-07-15 DIAGNOSIS — Z87891 Personal history of nicotine dependence: Secondary | ICD-10-CM | POA: Diagnosis not present

## 2019-07-15 LAB — URINALYSIS, ROUTINE W REFLEX MICROSCOPIC
Bilirubin Urine: NEGATIVE
Glucose, UA: NEGATIVE mg/dL
Hgb urine dipstick: NEGATIVE
Ketones, ur: NEGATIVE mg/dL
Leukocytes,Ua: NEGATIVE
Nitrite: NEGATIVE
Protein, ur: NEGATIVE mg/dL
Specific Gravity, Urine: 1.02 (ref 1.005–1.030)
pH: 7.5 (ref 5.0–8.0)

## 2019-07-15 LAB — COMPREHENSIVE METABOLIC PANEL
ALT: 19 U/L (ref 0–44)
AST: 20 U/L (ref 15–41)
Albumin: 4 g/dL (ref 3.5–5.0)
Alkaline Phosphatase: 31 U/L — ABNORMAL LOW (ref 38–126)
Anion gap: 8 (ref 5–15)
BUN: 11 mg/dL (ref 6–20)
CO2: 23 mmol/L (ref 22–32)
Calcium: 8.5 mg/dL — ABNORMAL LOW (ref 8.9–10.3)
Chloride: 105 mmol/L (ref 98–111)
Creatinine, Ser: 0.76 mg/dL (ref 0.44–1.00)
GFR calc Af Amer: >60 mL/min (ref >60)
GFR calc non Af Amer: >60 mL/min (ref >60)
Glucose, Bld: 96 mg/dL (ref 70–99)
Potassium: 4.2 mmol/L (ref 3.5–5.1)
Sodium: 136 mmol/L (ref 135–145)
Total Bilirubin: 0.6 mg/dL (ref 0.3–1.2)
Total Protein: 6.7 g/dL (ref 6.5–8.1)

## 2019-07-15 LAB — CBC WITH DIFFERENTIAL/PLATELET
Abs Immature Granulocytes: 0.01 10*3/uL (ref 0.00–0.07)
Basophils Absolute: 0 10*3/uL (ref 0.0–0.1)
Basophils Relative: 1 %
Eosinophils Absolute: 0 10*3/uL (ref 0.0–0.5)
Eosinophils Relative: 1 %
HCT: 36.3 % (ref 36.0–46.0)
Hemoglobin: 11.8 g/dL — ABNORMAL LOW (ref 12.0–15.0)
Immature Granulocytes: 0 %
Lymphocytes Relative: 34 %
Lymphs Abs: 1.2 10*3/uL (ref 0.7–4.0)
MCH: 31.6 pg (ref 26.0–34.0)
MCHC: 32.5 g/dL (ref 30.0–36.0)
MCV: 97.1 fL (ref 80.0–100.0)
Monocytes Absolute: 0.3 10*3/uL (ref 0.1–1.0)
Monocytes Relative: 8 %
Neutro Abs: 2.1 10*3/uL (ref 1.7–7.7)
Neutrophils Relative %: 56 %
Platelets: 251 10*3/uL (ref 150–400)
RBC: 3.74 MIL/uL — ABNORMAL LOW (ref 3.87–5.11)
RDW: 12.9 % (ref 11.5–15.5)
WBC: 3.7 10*3/uL — ABNORMAL LOW (ref 4.0–10.5)
nRBC: 0 % (ref 0.0–0.2)

## 2019-07-15 LAB — LIPASE, BLOOD: Lipase: 37 U/L (ref 11–51)

## 2019-07-15 LAB — PREGNANCY, URINE: Preg Test, Ur: NEGATIVE

## 2019-07-15 NOTE — ED Triage Notes (Signed)
Pt here with RUQ pain since last night after eating fatty meals yesterday. Nausea. Pain radiates through to back.

## 2019-07-15 NOTE — ED Provider Notes (Signed)
[facility] Provider Note   CSN: [id number] Arrival date & time: 07/15/19  7782     History Chief Complaint  Patient presents with  . Abdominal Pain    Aimee Escobar is a 39 y.o. female.  HPI      Presents with RUQ abdominal pain Woke up with it at 630AM Severe pain RUQ with radiation to the back, 10/10 Nausea, severe pain, difficulty to describe, sharp Nausea no vomiting No diarrhea, loose stool Been told GB had HIDA scan close to low functioning in past Had keto brownies last night coconut oil, alfredo, salad with dressing Felt hot/cold, no known fevers  Pain 5/10 now, took ibuprofen    Past Medical History:  Diagnosis Date  . Anemia   . Anxiety   . [facility]ell Patient virus infection   . Mononucleosis     There are no problems to display for this patient.   History reviewed. No pertinent surgical history.   OB History   No obstetric history on file.     Family History  Problem Relation Age of Onset  . Pancreatic cancer Father   . Colon cancer Neg Hx   . Esophageal cancer Neg Hx     Social History   Tobacco Use  . Smoking status: Former Research scientist (life sciences)  . Smokeless tobacco: Never Used  Vaping Use  . Vaping Use: Never used  Substance Use Topics  . Alcohol use: Not Currently  . Drug use: Not Currently    Home Medications Prior to Admission medications   Medication Sig Start Date End Date Taking? Authorizing Provider  Ascorbic Acid (VITAMIN C PO) Take by mouth.    [provider]  clonazePAM (KLONOPIN) 0.5 MG tablet Take 1 tablet by mouth as needed. 08/21/15   [provider]  Cyanocobalamin (VITAMIN B-12 PO) Take by mouth as needed.    [provider]  Ferrous Sulfate (IRON PO) Take by mouth as needed.    [provider]  MILK THISTLE PO Take 1 tablet by mouth daily.    [provider]  Multiple Vitamins-Minerals (ZINC PO) Take by mouth daily. 11 pumps daily    [provider]  pantoprazole (PROTONIX) 40 MG tablet Take 1 tablet (40 mg total) by mouth daily for 14 days. 02/04/19 02/18/19  Jackquline Denmark, MD  Vitamin D-Vitamin K (VITAMIN K2-VITAMIN D3 PO) Take by mouth.    [provider]    Allergies    Other and Tomato  Review of Systems   Review of Systems  Constitutional: Negative for fever.  HENT: Negative for sore throat.   Eyes: Negative for visual disturbance.  Respiratory: Negative for cough and shortness of breath.   Cardiovascular: Negative for chest pain.  Gastrointestinal: Positive for abdominal pain and nausea. Negative for constipation, diarrhea and vomiting.  Genitourinary: Negative for difficulty urinating and dysuria.  Musculoskeletal: Negative for back pain and neck pain.  Skin: Negative for rash.  Neurological: Negative for syncope and headaches.    Physical Exam Updated Vital Signs BP (!) 99/56 (BP Location: Right Arm)   Pulse 62   Temp 97.9 F (36.6 C) (Oral)   Resp 16   Ht 5\' 4"  (1.626 m)   Wt 83.3 kg   LMP 06/27/2019   SpO2 99%   BMI 31.51 kg/m   Physical Exam Vitals and nursing note reviewed.  Constitutional:      General: She is not in acute distress.    Appearance: Normal appearance. She is  not ill-appearing, toxic-appearing or diaphoretic.  HENT:     Head: Normocephalic.  Eyes:     Conjunctiva/sclera: Conjunctivae normal.  Cardiovascular:     Rate and Rhythm: Normal rate and regular rhythm.     Pulses: Normal pulses.  Pulmonary:     Effort: Pulmonary effort is normal. No respiratory distress.  Abdominal:     Tenderness: There is abdominal tenderness in the right upper quadrant and epigastric area. Negative signs include Murphy's sign and McBurney's sign.  Musculoskeletal:        General: No deformity or signs of injury.     Cervical back: No rigidity.  Skin:    General: Skin is warm and dry.     Coloration: Skin is not jaundiced or pale.  Neurological:     General: No focal deficit  present.     Mental Status: She is alert and oriented to person, place, and time.     ED Results / Procedures / Treatments   Labs (all labs ordered are listed, but only abnormal results are displayed) Labs Reviewed  CBC WITH DIFFERENTIAL/PLATELET - Abnormal; Notable for the following components:      Result Value   WBC 3.7 (*)    RBC 3.74 (*)    Hemoglobin 11.8 (*)    All other components within normal limits  COMPREHENSIVE METABOLIC PANEL - Abnormal; Notable for the following components:   Calcium 8.5 (*)    Alkaline Phosphatase 31 (*)    All other components within normal limits  URINE CULTURE  LIPASE, BLOOD  URINALYSIS, ROUTINE W REFLEX MICROSCOPIC  PREGNANCY, URINE    EKG None  Radiology CT Renal Stone Study  Result Date: 07/15/2019 CLINICAL DATA:  Right-sided flank pain with kidney stone suspected EXAM: CT ABDOMEN AND PELVIS WITHOUT CONTRAST TECHNIQUE: Multidetector CT imaging of the abdomen and pelvis was performed following the standard protocol without IV contrast. COMPARISON:  None. FINDINGS: Lower chest:  Small sliding hiatal hernia Hepatobiliary: No focal liver abnormality.No evidence of biliary obstruction or stone. Pancreas: Unremarkable. Spleen: Unremarkable. Adrenals/Urinary Tract: Negative adrenals. At least 4 small right renal calculi measuring up to 2 mm at the lower pole. Punctate left upper pole calculus. No hydronephrosis. Unremarkable bladder. Stomach/Bowel:  No obstruction. No appendicitis. Vascular/Lymphatic: No acute vascular abnormality. No mass or adenopathy. Reproductive:No pathologic findings. Mild abdominal wall bulging at the umbilicus without discrete hernia Other: Trace pelvic fluid, usually physiologic with patient's demographics Musculoskeletal: No acute abnormalities. IMPRESSION: Nephrolithiasis without hydronephrosis or ureteral calculus. Trace pelvic fluid, usually physiologic. Electronically Signed   By: Marnee Spring M.D.   On: 07/15/2019 11:44    US Abdomen Limited RUQ  Result Date: 07/15/2019 CLINICAL DATA:  39 year old female with history of right upper quadrant abdominal pain. EXAM: ULTRASOUND ABDOMEN LIMITED RIGHT UPPER QUADRANT COMPARISON:  Abdominal ultrasound 02/10/2019. FINDINGS: Gallbladder: No gallstones or wall thickening visualized. No sonographic Murphy sign noted by sonographer. Common bile duct: Diameter: 2.0 mm Liver: No focal lesion identified. Within normal limits in parenchymal echogenicity. Portal vein is patent on color Doppler imaging with normal direction of blood flow towards the liver. Other: None. IMPRESSION: 1. No acute findings are noted to account for the patient's symptoms. Specifically, no cholelithiasis or findings to suggest acute cholecystitis at this time. Electronically Signed   By: Trudie Reed M.D.   On: 07/15/2019 10:53    Procedures Procedures (including critical care time)  Medications Ordered in ED Medications - No data to display  ED Course  I have reviewed  the triage vital signs and the nursing notes.  Pertinent labs & imaging results that were available during my care of the patient were reviewed by me and considered in my medical decision making (see chart for details).    MDM Rules/Calculators/A&P                          38yo female presents with concern for RUQ abdominal pain.  DDx includes appendicitis, pancreatitis, cholecystitis, pyelonephritis, nephrolithiasis, diverticulitis, PID, ovarian torsion, ectopic pregnancy, and tuboovarian abscess.  Not having lower abdominal pain, doubt pelvic etiology.  Pregnancy test negative. No signs of pyelonephritis on UA.  US shows no evidence of cholelithiasis or cholecysitits. No sign of obstructing nephrolithiasis on CT stone study. Feels improved while in ED. Recommend follow up with PCP.   Final Clinical Impression(s) / ED Diagnoses Final diagnoses:  RUQ abdominal pain    Rx / DC Orders ED Discharge Orders    None         Alvira Monday, MD 07/15/19 2234

## 2019-07-16 ENCOUNTER — Telehealth: Payer: Self-pay | Admitting: Gastroenterology

## 2019-07-16 LAB — URINE CULTURE: Culture: 10000 — AB

## 2019-07-16 NOTE — Telephone Encounter (Signed)
Spoke with patient - she was seen in the ED yesterday and was told to follow up with PCP. No meds given. She is still having pain.  She did not start Pantoprazole as prescribed at last visit because she had heard bad things about this type of medication.  She stated she is going to pick up today and I made her an appointment for end of July.

## 2019-07-19 NOTE — Telephone Encounter (Signed)
Can schedule in APP clinic (if anything is available earlier) or are can put her on waiting list-in case of any cancellation RG

## 2019-08-11 ENCOUNTER — Emergency Department (HOSPITAL_BASED_OUTPATIENT_CLINIC_OR_DEPARTMENT_OTHER)
Admission: EM | Admit: 2019-08-11 | Discharge: 2019-08-11 | Disposition: A | Discharge status: Home / Self Care | Payer: Medicaid Other | Attending: Emergency Medicine | Admitting: Emergency Medicine

## 2019-08-11 ENCOUNTER — Emergency Department (HOSPITAL_BASED_OUTPATIENT_CLINIC_OR_DEPARTMENT_OTHER): Payer: Medicaid Other

## 2019-08-11 ENCOUNTER — Encounter (HOSPITAL_BASED_OUTPATIENT_CLINIC_OR_DEPARTMENT_OTHER): Payer: Self-pay | Admitting: *Deleted

## 2019-08-11 ENCOUNTER — Other Ambulatory Visit: Payer: Self-pay

## 2019-08-11 DIAGNOSIS — M79602 Pain in left arm: Secondary | ICD-10-CM

## 2019-08-11 DIAGNOSIS — F17200 Nicotine dependence, unspecified, uncomplicated: Secondary | ICD-10-CM | POA: Diagnosis not present

## 2019-08-11 DIAGNOSIS — R0789 Other chest pain: Secondary | ICD-10-CM

## 2019-08-11 LAB — COMPREHENSIVE METABOLIC PANEL
ALT: 15 U/L (ref 0–44)
AST: 16 U/L (ref 15–41)
Albumin: 4 g/dL (ref 3.5–5.0)
Alkaline Phosphatase: 37 U/L — ABNORMAL LOW (ref 38–126)
Anion gap: 8 (ref 5–15)
BUN: 13 mg/dL (ref 6–20)
CO2: 24 mmol/L (ref 22–32)
Calcium: 8.6 mg/dL — ABNORMAL LOW (ref 8.9–10.3)
Chloride: 105 mmol/L (ref 98–111)
Creatinine, Ser: 0.86 mg/dL (ref 0.44–1.00)
GFR calc Af Amer: >60 mL/min (ref >60)
GFR calc non Af Amer: >60 mL/min (ref >60)
Glucose, Bld: 111 mg/dL — ABNORMAL HIGH (ref 70–99)
Potassium: 3.8 mmol/L (ref 3.5–5.1)
Sodium: 137 mmol/L (ref 135–145)
Total Bilirubin: 0.3 mg/dL (ref 0.3–1.2)
Total Protein: 6.6 g/dL (ref 6.5–8.1)

## 2019-08-11 LAB — CBC WITH DIFFERENTIAL/PLATELET
Abs Immature Granulocytes: 0.02 10*3/uL (ref 0.00–0.07)
Basophils Absolute: 0.1 10*3/uL (ref 0.0–0.1)
Basophils Relative: 1 %
Eosinophils Absolute: 0 10*3/uL (ref 0.0–0.5)
Eosinophils Relative: 1 %
HCT: 35.1 % — ABNORMAL LOW (ref 36.0–46.0)
Hemoglobin: 11.8 g/dL — ABNORMAL LOW (ref 12.0–15.0)
Immature Granulocytes: 0 %
Lymphocytes Relative: 28 %
Lymphs Abs: 1.6 10*3/uL (ref 0.7–4.0)
MCH: 31.8 pg (ref 26.0–34.0)
MCHC: 33.6 g/dL (ref 30.0–36.0)
MCV: 94.6 fL (ref 80.0–100.0)
Monocytes Absolute: 0.5 10*3/uL (ref 0.1–1.0)
Monocytes Relative: 8 %
Neutro Abs: 3.6 10*3/uL (ref 1.7–7.7)
Neutrophils Relative %: 62 %
Platelets: 258 10*3/uL (ref 150–400)
RBC: 3.71 MIL/uL — ABNORMAL LOW (ref 3.87–5.11)
RDW: 12.3 % (ref 11.5–15.5)
WBC: 5.8 10*3/uL (ref 4.0–10.5)
nRBC: 0 % (ref 0.0–0.2)

## 2019-08-11 LAB — URINALYSIS, ROUTINE W REFLEX MICROSCOPIC
Bilirubin Urine: NEGATIVE
Glucose, UA: NEGATIVE mg/dL
Hgb urine dipstick: NEGATIVE
Ketones, ur: NEGATIVE mg/dL
Leukocytes,Ua: NEGATIVE
Nitrite: NEGATIVE
Protein, ur: NEGATIVE mg/dL
Specific Gravity, Urine: 1.01 (ref 1.005–1.030)
pH: 7.5 (ref 5.0–8.0)

## 2019-08-11 LAB — PREGNANCY, URINE: Preg Test, Ur: NEGATIVE

## 2019-08-11 LAB — TROPONIN I (HIGH SENSITIVITY)
Troponin I (High Sensitivity): <2 ng/L (ref <18)
Troponin I (High Sensitivity): <2 ng/L (ref <18)

## 2019-08-11 MED ORDER — NAPROXEN 250 MG PO TABS: 500.0000 mg | ORAL_TABLET | Freq: Two times a day (BID) | ORAL | 0 refills | Status: DC

## 2019-08-11 NOTE — Discharge Instructions (Signed)
Wear the splint at night to help keep your hand in the right position. Take naproxen 2 times a day with meals.  Do not take other anti-inflammatories at the same time (Advil, Motrin, ibuprofen, Aleve). You may supplement with Tylenol if you need further pain control. Use ice packs or heating pads if this helps control your pain. Follow-up with the hand doctor listed below if your arm/persistent causing problems. Follow-up with the heart doctors listed below for further evaluation of your heart symptoms. Return to the emergency room if any new, worsening, or concerning symptoms.

## 2019-08-11 NOTE — ED Triage Notes (Signed)
C/o mid sternal cp with left arm numbness x 2 days

## 2019-08-11 NOTE — ED Provider Notes (Signed)
[facility] EMERGENCY DEPARTMENT Provider Note   CSN: [id number] Arrival date & time: 08/11/19  1625     History Chief Complaint  Patient presents with  . Chest Pain    Aimee Escobar is a 39 y.o. female presenting for evaluation of chest pain and left arm pain.  Patient states yesterday she felt very tired.  She had nausea, but no vomiting.  She developed pain in her left arm, mostly in her forearm and wrist.  It feels like it is radiating up to her shoulder.  She reports intermittent tingling in her left arm.  Today, she developed chest pain.  She denies associated shortness of breath.  She has fevers, chills, cough, abdominal pain, urinary symptoms, abnormal bowel movements.  She has not taken anything for her symptoms.  She reports associated feeling of palpitations.  She states she has been evaluated cardiology approximately 5 years ago in [place], no work-up since.  He smokes cigarettes.  She denies alcohol or drug use.  She denies family history of early cardiac death.  HPI     Past Medical History:  Diagnosis Date  . Anemia   . Anxiety   . Malachi Carl virus infection   . Mononucleosis     There are no problems to display for this patient.   History reviewed. No pertinent surgical history.   OB History   No obstetric history on file.     Family History  Problem Relation Age of Onset  . Pancreatic cancer Father   . Colon cancer Neg Hx   . Esophageal cancer Neg Hx     Social History   Tobacco Use  . Smoking status: Current Every Day Smoker  . Smokeless tobacco: Never Used  Vaping Use  . Vaping Use: Never used  Substance Use Topics  . Alcohol use: Not Currently  . Drug use: Not Currently    Home Medications Prior to Admission medications   Medication Sig Start Date End Date Taking? Authorizing Provider  Ascorbic Acid (VITAMIN C PO) Take by mouth.    [provider]  clonazePAM (KLONOPIN) 0.5 MG tablet Take 1 tablet by mouth  as needed. 08/21/15   [provider]  Cyanocobalamin (VITAMIN B-12 PO) Take by mouth as needed.    [provider]  Ferrous Sulfate (IRON PO) Take by mouth as needed.    [provider]  MILK THISTLE PO Take 1 tablet by mouth daily.    [provider]  Multiple Vitamins-Minerals (ZINC PO) Take by mouth daily. 11 pumps daily    [provider]  naproxen (NAPROSYN) 250 MG tablet Take 2 tablets (500 mg total) by mouth 2 (two) times daily with a meal. 08/11/19   Sterling Mondo, PA-C  pantoprazole (PROTONIX) 40 MG tablet Take 1 tablet (40 mg total) by mouth daily for 14 days. 02/04/19 02/18/19  Lynann Bologna, MD  Vitamin D-Vitamin K (VITAMIN K2-VITAMIN D3 PO) Take by mouth.    [provider]    Allergies    Other and Tomato  Review of Systems   Review of Systems  Cardiovascular: Positive for chest pain.  Musculoskeletal: Positive for myalgias.  All other systems reviewed and are negative.   Physical Exam Updated Vital Signs BP 100/66   Pulse 63   Temp 98.8 F (37.1 C) (Oral)   Resp 16   Ht 5\' 5"  (1.651 m)   Wt 84.4 kg   LMP 07/14/2019   SpO2 100%   BMI 30.95  kg/m   Physical Exam Vitals and nursing note reviewed.  Constitutional:      General: She is not in acute distress.    Appearance: She is well-developed.     Comments: Resting in the bed in NAD  HENT:     Head: Normocephalic and atraumatic.  Eyes:     Extraocular Movements: Extraocular movements intact.     Conjunctiva/sclera: Conjunctivae normal.     Pupils: Pupils are equal, round, and reactive to light.  Cardiovascular:     Rate and Rhythm: Normal rate and regular rhythm.     Pulses: Normal pulses.  Pulmonary:     Effort: Pulmonary effort is normal. No respiratory distress.     Breath sounds: Normal breath sounds. No wheezing.     Comments: Clear lung sounds in all fields.  Abdominal:     General: There is no distension.     Palpations: Abdomen is soft.  There is no mass.     Tenderness: There is no abdominal tenderness. There is no guarding or rebound.  Musculoskeletal:     Cervical back: Normal range of motion and neck supple.     Right lower leg: No edema.     Left lower leg: No edema.     Comments: ttp of the L forearm. Positive tinnels. No swelling. Radial pulses 2+ bilaterally  Skin:    General: Skin is warm and dry.     Capillary Refill: Capillary refill takes less than 2 seconds.  Neurological:     Mental Status: She is alert and oriented to person, place, and time.     ED Results / Procedures / Treatments   Labs (all labs ordered are listed, but only abnormal results are displayed) Labs Reviewed  CBC WITH DIFFERENTIAL/PLATELET - Abnormal; Notable for the following components:      Result Value   RBC 3.71 (*)    Hemoglobin 11.8 (*)    HCT 35.1 (*)    All other components within normal limits  COMPREHENSIVE METABOLIC PANEL - Abnormal; Notable for the following components:   Glucose, Bld 111 (*)    Calcium 8.6 (*)    Alkaline Phosphatase 37 (*)    All other components within normal limits  PREGNANCY, URINE  URINALYSIS, ROUTINE W REFLEX MICROSCOPIC  TROPONIN I (HIGH SENSITIVITY)  TROPONIN I (HIGH SENSITIVITY)    EKG EKG Interpretation  Date/Time:  Wednesday August 11 2019 16:32:42 EDT Ventricular Rate:  99 PR Interval:  126 QRS Duration: 80 QT Interval:  348 QTC Calculation: 446 R Axis:   31 Text Interpretation: Normal sinus rhythm with sinus arrhythmia Nonspecific ST abnormality Abnormal ECG Confirmed by Geoffery Lyons ([id number]) on 08/11/2019 5:24:05 PM   Radiology DG Chest 2 View  Result Date: 08/11/2019 CLINICAL DATA:  Chest pain EXAM: CHEST - 2 VIEW COMPARISON:  None. FINDINGS: The heart size and mediastinal contours are within normal limits. Both lungs are clear. The visualized skeletal structures are unremarkable. IMPRESSION: No active cardiopulmonary disease. Electronically Signed   By: Jonna Clark M.D.    On: 08/11/2019 16:58    Procedures Procedures (including critical care time)  Medications Ordered in ED Medications - No data to display  ED Course  I have reviewed the triage vital signs and the nursing notes.  Pertinent labs & imaging results that were available during my care of the patient were reviewed by me and considered in my medical decision making (see chart for details).    MDM Rules/Calculators/A&P  Pt presenting for evaluation of chest pain and L arm pain.  Pain is in the wrist and forearm, and radiates to the shoulder.  On exam, she has a positive Tinel's.  Likely carpal tunnel syndrome.  She reports a history of similar in the past when she was pregnant.  Discussed symptomatic treatment with NSAIDs and wrist brace.  Additionally, patient is having left chest pain.  Story is atypical, and patient is nontoxic in appearance.  He has been seen multiple times in the past several months for chest pain, upper abdominal pain, shortness of breath.  Will obtain labs, chest x-ray, EKG.  EKG shows sinus arrhythmia, but is nonischemic.  Labs overall reassuring.  Trop negative x2.  Hemoglobin stable.  Electrolytes stable.  Chest x-ray viewed interpreted by me, no pneumonia, pneumothorax, effusion, cardiomegaly. Discussed findings with pt. Discussed f/u with cardiology OP for further evaluation. At this time, pt appears safe for d/c. Return precautions given. Pt states she understands and agrees to plan.   Final Clinical Impression(s) / ED Diagnoses Final diagnoses:  Left arm pain  Atypical chest pain    Rx / DC Orders ED Discharge Orders         Ordered    naproxen (NAPROSYN) 250 MG tablet  2 times daily with meals     Discontinue  Reprint     08/11/19 2023           Alveria Apley, PA-C 08/11/19 2252    Geoffery Lyons, MD 08/11/19 2359

## 2019-08-18 ENCOUNTER — Telehealth: Payer: Self-pay | Admitting: Gastroenterology

## 2019-08-18 ENCOUNTER — Ambulatory Visit: Payer: Medicaid Other | Admitting: Gastroenterology

## 2019-08-18 NOTE — Telephone Encounter (Signed)
Pt was scheduled for an appt with Dr. Chales Abrahams this morning at 9:30am. She called stating that she will not be able to make it to appt. She stated that the oly thing that she needs is a rf of Pantoprazole and no longer needs appt. Pls send rf to [facility] on [address].  in [place].

## 2019-08-19 ENCOUNTER — Other Ambulatory Visit: Payer: Self-pay

## 2019-08-19 MED ORDER — PANTOPRAZOLE SODIUM 40 MG PO TBEC: 40.0000 mg | DELAYED_RELEASE_TABLET | Freq: Every day | ORAL | 0 refills | Status: AC

## 2019-08-19 NOTE — Telephone Encounter (Signed)
Spoke with patient this morning regarding refill.  I told her 30 day supply sent to pharmacy until she is seen.  Appt made for 09/22/19 at 3 pm.  Pt was given a prescription for 14 pills in January.  She states she did not get it filled until recently. She wanted to know why she needs to come in for refill? I explained to her she may need to be further evaluated if still having problems.  She was supposed to follow up in three months from initial visit in January.

## 2019-09-22 ENCOUNTER — Ambulatory Visit: Payer: Medicaid Other | Admitting: Gastroenterology

## 2020-03-01 ENCOUNTER — Other Ambulatory Visit: Payer: Self-pay

## 2020-03-01 ENCOUNTER — Encounter (HOSPITAL_BASED_OUTPATIENT_CLINIC_OR_DEPARTMENT_OTHER): Payer: Self-pay | Admitting: *Deleted

## 2020-03-01 ENCOUNTER — Emergency Department (HOSPITAL_BASED_OUTPATIENT_CLINIC_OR_DEPARTMENT_OTHER)
Admission: EM | Admit: 2020-03-01 | Discharge: 2020-03-02 | Disposition: A | Discharge status: Home / Self Care | Payer: 59 | Attending: Emergency Medicine | Admitting: Emergency Medicine

## 2020-03-01 ENCOUNTER — Emergency Department (HOSPITAL_BASED_OUTPATIENT_CLINIC_OR_DEPARTMENT_OTHER): Payer: 59

## 2020-03-01 DIAGNOSIS — R5381 Other malaise: Secondary | ICD-10-CM | POA: Diagnosis present

## 2020-03-01 DIAGNOSIS — R0600 Dyspnea, unspecified: Secondary | ICD-10-CM

## 2020-03-01 DIAGNOSIS — F17200 Nicotine dependence, unspecified, uncomplicated: Secondary | ICD-10-CM | POA: Diagnosis not present

## 2020-03-01 DIAGNOSIS — U071 COVID-19: Secondary | ICD-10-CM | POA: Diagnosis not present

## 2020-03-01 NOTE — ED Triage Notes (Signed)
COvid + x 1 week , SOb x 2 days

## 2020-03-01 NOTE — ED Provider Notes (Signed)
[facility] Provider Note: Lowella Dell, MD, FACEP  CSN: [id number] MRN: [id number] ARRIVAL: 03/01/20 at 2120 ROOM: [facility]01/[facility]   CHIEF COMPLAINT  Covid Positive   HISTORY OF PRESENT ILLNESS  03/01/20 11:52 PM Aimee Escobar is a 40 y.o. female with 1 week of Covid symptoms.  This began with loss of taste and general malaise and weakness.  She subsequently developed shortness of breath, chest discomfort with breathing or coughing and nasal congestion.  She tested positive for Covid on 02/23/2020.  She subsequently improved but for the past 2 days her shortness of breath and chest discomfort have worsened.  Symptoms are moderate and somewhat worse with exertion.  She has an albuterol inhaler but it is broken.   Past Medical History:  Diagnosis Date  . Anemia   . Anxiety   . Malachi Carl virus infection   . Mononucleosis     History reviewed. No pertinent surgical history.  Family History  Problem Relation Age of Onset  . Pancreatic cancer Father   . Colon cancer Neg Hx   . Esophageal cancer Neg Hx     Social History   Tobacco Use  . Smoking status: Current Every Day Smoker  . Smokeless tobacco: Never Used  Vaping Use  . Vaping Use: Never used  Substance Use Topics  . Alcohol use: Yes    Comment: soc  . Drug use: Not Currently    Prior to Admission medications   Medication Sig Start Date End Date Taking? Authorizing Provider  Ascorbic Acid (VITAMIN C PO) Take by mouth.    [provider]  clonazePAM (KLONOPIN) 0.5 MG tablet Take 1 tablet by mouth as needed. 08/21/15   [provider]  Cyanocobalamin (VITAMIN B-12 PO) Take by mouth as needed.    [provider]  Ferrous Sulfate (IRON PO) Take by mouth as needed.    [provider]  MILK THISTLE PO Take 1 tablet by mouth daily.    [provider]  Multiple Vitamins-Minerals (ZINC PO) Take by mouth daily. 11 pumps daily    [provider]  naproxen  (NAPROSYN) 250 MG tablet Take 2 tablets (500 mg total) by mouth 2 (two) times daily with a meal. 08/11/19   Caccavale, Sophia, PA-C  pantoprazole (PROTONIX) 40 MG tablet Take 1 tablet (40 mg total) by mouth daily. 08/19/19 09/18/19  Lynann Bologna, MD  Vitamin D-Vitamin K (VITAMIN K2-VITAMIN D3 PO) Take by mouth.    [provider]    Allergies Other and Tomato   REVIEW OF SYSTEMS  Negative except as noted here or in the History of Present Illness.   PHYSICAL EXAMINATION  Initial Vital Signs Blood pressure (!) 104/56, pulse 63, temperature 97.7 F (36.5 C), temperature source Oral, resp. rate 17, height 5\' 4"  (1.626 m), weight 81.6 kg, last menstrual period 02/23/2020, SpO2 100 %.  Examination General: Well-developed, well-nourished female in no acute distress; appearance consistent with age of record HENT: normocephalic; atraumatic Eyes: pupils equal, round and reactive to light; extraocular muscles intact Neck: supple Heart: regular rate and rhythm Lungs: Mildly decreased air movement bilaterally Abdomen: soft; nondistended; nontender; bowel sounds present Extremities: No deformity; full range of motion; pulses normal Neurologic: Awake, alert and oriented; motor function intact in all extremities and symmetric; no facial droop Skin: Warm and dry Psychiatric: Normal mood and affect   RESULTS  Summary of this visit's results, reviewed and interpreted by myself:   EKG Interpretation  Date/Time:    Ventricular  Rate:    PR Interval:    QRS Duration:   QT Interval:    QTC Calculation:   R Axis:     Text Interpretation:        Laboratory Studies: No results found for this or any previous visit (from the past 24 hour(s)). Imaging Studies: DG Chest Portable 1 View  Result Date: 03/01/2020 CLINICAL DATA:  COVID-19 positivity with shortness of breath EXAM: PORTABLE CHEST 1 VIEW COMPARISON:  08/11/2019 FINDINGS: The heart size and mediastinal contours are within normal  limits. Both lungs are clear. The visualized skeletal structures are unremarkable. IMPRESSION: No active disease. Electronically Signed   By: Alcide Clever M.D.   On: 03/01/2020 21:57    ED COURSE and MDM  Nursing notes, initial and subsequent vitals signs, including pulse oximetry, reviewed and interpreted by myself.  Vitals:   03/01/20 2125 03/01/20 2126 03/01/20 2245  BP:  134/77 (!) 104/56  Pulse:  80 63  Resp:  20 17  Temp:  97.7 F (36.5 C)   TempSrc:  Oral   SpO2:  100% 100%  Weight: 81.6 kg    Height: 5\' 4"  (1.626 m)     Medications - No data to display  We will provide the patient with a new albuterol inhaler as this may help her dyspnea.  She was advised to take NSAIDs for her chest discomfort.  PROCEDURES  Procedures   ED DIAGNOSES     ICD-10-CM   1. Dyspnea due to COVID-19  U07.1    R06.00        Toure Edmonds, , MD 03/02/20 0010

## 2020-03-02 DIAGNOSIS — U071 COVID-19: Secondary | ICD-10-CM | POA: Diagnosis not present

## 2020-03-02 MED ORDER — ALBUTEROL SULFATE HFA 108 (90 BASE) MCG/ACT IN AERS
2.0000 | INHALATION_SPRAY | RESPIRATORY_TRACT | Status: DC | PRN
  Administered 2020-03-02: 2 via RESPIRATORY_TRACT
  Filled 2020-03-02: qty 6.7

## 2020-05-27 ENCOUNTER — Other Ambulatory Visit: Payer: Self-pay

## 2020-05-27 ENCOUNTER — Emergency Department (HOSPITAL_BASED_OUTPATIENT_CLINIC_OR_DEPARTMENT_OTHER)
Admission: EM | Admit: 2020-05-27 | Discharge: 2020-05-28 | Disposition: A | Discharge status: Home / Self Care | Payer: 59 | Attending: Emergency Medicine | Admitting: Emergency Medicine

## 2020-05-27 DIAGNOSIS — R110 Nausea: Secondary | ICD-10-CM | POA: Diagnosis not present

## 2020-05-27 DIAGNOSIS — F17200 Nicotine dependence, unspecified, uncomplicated: Secondary | ICD-10-CM | POA: Insufficient documentation

## 2020-05-27 DIAGNOSIS — R1011 Right upper quadrant pain: Secondary | ICD-10-CM

## 2020-05-27 LAB — URINALYSIS, ROUTINE W REFLEX MICROSCOPIC
Bilirubin Urine: NEGATIVE
Glucose, UA: NEGATIVE mg/dL
Hgb urine dipstick: NEGATIVE
Ketones, ur: NEGATIVE mg/dL
Leukocytes,Ua: NEGATIVE
Nitrite: NEGATIVE
Protein, ur: NEGATIVE mg/dL
Specific Gravity, Urine: 1.009 (ref 1.005–1.030)
pH: 5.5 (ref 5.0–8.0)

## 2020-05-27 LAB — COMPREHENSIVE METABOLIC PANEL
ALT: 20 U/L (ref 0–44)
AST: 20 U/L (ref 15–41)
Albumin: 4.4 g/dL (ref 3.5–5.0)
Alkaline Phosphatase: 44 U/L (ref 38–126)
Anion gap: 10 (ref 5–15)
BUN: 17 mg/dL (ref 6–20)
CO2: 25 mmol/L (ref 22–32)
Calcium: 9.6 mg/dL (ref 8.9–10.3)
Chloride: 103 mmol/L (ref 98–111)
Creatinine, Ser: 1.04 mg/dL — ABNORMAL HIGH (ref 0.44–1.00)
GFR, Estimated: >60 mL/min (ref >60)
Glucose, Bld: 106 mg/dL — ABNORMAL HIGH (ref 70–99)
Potassium: 3.4 mmol/L — ABNORMAL LOW (ref 3.5–5.1)
Sodium: 138 mmol/L (ref 135–145)
Total Bilirubin: 0.8 mg/dL (ref 0.3–1.2)
Total Protein: 6.9 g/dL (ref 6.5–8.1)

## 2020-05-27 LAB — CBC
HCT: 36.5 % (ref 36.0–46.0)
Hemoglobin: 12.2 g/dL (ref 12.0–15.0)
MCH: 30.8 pg (ref 26.0–34.0)
MCHC: 33.4 g/dL (ref 30.0–36.0)
MCV: 92.2 fL (ref 80.0–100.0)
Platelets: 335 10*3/uL (ref 150–400)
RBC: 3.96 MIL/uL (ref 3.87–5.11)
RDW: 12.7 % (ref 11.5–15.5)
WBC: 7 10*3/uL (ref 4.0–10.5)
nRBC: 0 % (ref 0.0–0.2)

## 2020-05-27 LAB — PREGNANCY, URINE: Preg Test, Ur: NEGATIVE

## 2020-05-27 LAB — LIPASE, BLOOD: Lipase: 60 U/L — ABNORMAL HIGH (ref 11–51)

## 2020-05-27 MED ORDER — ALUM & MAG HYDROXIDE-SIMETH 200-200-20 MG/5ML PO SUSP
30.0000 mL | Freq: Once | ORAL | Status: AC
  Administered 2020-05-27: 30 mL via ORAL
  Filled 2020-05-27: qty 30

## 2020-05-27 MED ORDER — ONDANSETRON HCL 4 MG/2ML IJ SOLN
4.0000 mg | Freq: Once | INTRAMUSCULAR | Status: AC | PRN
  Administered 2020-05-27: 4 mg via INTRAVENOUS
  Filled 2020-05-27: qty 2

## 2020-05-27 NOTE — ED Triage Notes (Signed)
Pt c/o dull RUQ pain and nausea for the past two days. Pt states that she is also concerned about her stool because it is "yellow" in color. Denies vomiting or diarrhea.

## 2020-05-28 NOTE — ED Provider Notes (Signed)
[facility] EMERGENCY DEPT Provider Note   CSN: [id number] Arrival date & time: 05/27/20  2209     History Chief Complaint  Patient presents with  . Abdominal Pain  . Nausea    Aimee Escobar is a 40 y.o. female.  The history is provided by the patient.  Abdominal Pain Pain location:  RUQ Pain quality: sharp   Pain quality comment:  Pulsating Pain radiates to:  Does not radiate Pain severity:  Moderate Onset quality:  Gradual Duration:  1 day Timing:  Intermittent Progression:  Improving Chronicity:  New Context: eating   Relieved by:  Nothing Worsened by:  Nothing Associated symptoms: nausea   Associated symptoms: no chest pain, no dysuria, no fever, no hematemesis, no hematochezia, no vaginal bleeding, no vaginal discharge and no vomiting   Associated symptoms comment:  Loose stool that is yellow Risk factors: has not had multiple surgeries and not pregnant   Patient reports right upper quadrant abdominal pain for the past day.  She reports this is worse with eating.  It does not radiate.  She is now feeling improved.  She is now taking p.o. fluids.  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  Patient also reports when she had a bowel movement earlier it was loose and appeared yellow.  She is reported that she has a bowel duct obstruction.  Patient reports she saw a natural physician about a month ago and had a battery of tests ordered.  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 Anemia   . Anxiety   . Malachi Carl virus infection   . Mononucleosis     There are no problems to display for this patient.   No past surgical history on file.   OB History   No obstetric history on file.     Family History  Problem Relation Age of Onset  . Pancreatic cancer Father   . Colon cancer Neg Hx   . Esophageal cancer Neg Hx     Social  History   Tobacco Use  . Smoking status: Current Every Day Smoker  . Smokeless tobacco: Never Used  Vaping Use  . Vaping Use: Never used  Substance Use Topics  . Alcohol use: Yes    Comment: soc  . Drug use: Not Currently    Home Medications Prior to Admission medications   Medication Sig Start Date End Date Taking? Authorizing Provider  Ascorbic Acid (VITAMIN C PO) Take by mouth.    [provider]  clonazePAM (KLONOPIN) 0.5 MG tablet Take 1 tablet by mouth as needed. 08/21/15   [provider]  Cyanocobalamin (VITAMIN B-12 PO) Take by mouth as needed.    [provider]  Ferrous Sulfate (IRON PO) Take by mouth as needed.    [provider]  MILK THISTLE PO Take 1 tablet by mouth daily.    [provider]  Multiple Vitamins-Minerals (ZINC PO) Take by mouth daily. 11 pumps daily    [provider]  naproxen (NAPROSYN) 250 MG tablet Take 2 tablets (500 mg total) by mouth 2 (two) times daily with a meal. 08/11/19   Caccavale, Sophia, PA-C  pantoprazole (PROTONIX) 40 MG tablet Take 1 tablet (40 mg total) by mouth daily. 08/19/19 09/18/19  Lynann Bologna, MD  Vitamin D-Vitamin K (VITAMIN K2-VITAMIN D3 PO) Take by mouth.    [provider]    Allergies    Other and Tomato  Review of Systems   Review of Systems  Constitutional: Negative for fever.  Cardiovascular: Negative for chest pain.  Gastrointestinal: Positive for abdominal pain and nausea. Negative for hematemesis, hematochezia and vomiting.  Genitourinary: Negative for dysuria, vaginal bleeding and vaginal discharge.  All other systems reviewed and are negative.   Physical Exam Updated Vital Signs BP 105/73   Pulse 64   Temp 97.8 F (36.6 C) (Oral)   Resp 15   Ht 1.626 m (5\' 4" )   Wt 81.8 kg   LMP 05/20/2020   SpO2 98%   BMI 30.95 kg/m   Physical Exam CONSTITUTIONAL: Well developed/well nourished, no distress HEAD: Normocephalic/atraumatic EYES:  EOMI/PERRL, no icterus ENMT: Mucous membranes moist NECK: supple no meningeal signs SPINE/BACK:entire spine nontender CV: S1/S2 noted, no murmurs/rubs/gallops noted LUNGS: Lungs are clear to auscultation bilaterally, no apparent distress ABDOMEN: soft, nontender, no rebound or guarding, bowel sounds noted throughout abdomen, no RUQ tenderness GU:no cva tenderness NEURO: Pt is awake/alert/appropriate, moves all extremitiesx4.  No facial droop.   EXTREMITIES: pulses normal/equal, full ROM SKIN: warm, color normal PSYCH: no abnormalities of mood noted, alert and oriented to situation  ED Results / Procedures / Treatments   Labs (all labs ordered are listed, but only abnormal results are displayed) Labs Reviewed  LIPASE, BLOOD - Abnormal; Notable for the following components:      Result Value   Lipase 60 (*)    All other components within normal limits  COMPREHENSIVE METABOLIC PANEL - Abnormal; Notable for the following components:   Potassium 3.4 (*)    Glucose, Bld 106 (*)    Creatinine, Ser 1.04 (*)    All other components within normal limits  URINALYSIS, ROUTINE W REFLEX MICROSCOPIC - Abnormal; Notable for the following components:   Color, Urine COLORLESS (*)    All other components within normal limits  CBC  PREGNANCY, URINE    EKG EKG Interpretation  Date/Time:  Saturday May 27 2020 23:24:14 EDT Ventricular Rate:  72 PR Interval:  143 QRS Duration: 95 QT Interval:  412 QTC Calculation: 451 R Axis:   51 Text Interpretation: Sinus rhythm Confirmed by [id number] (Zadie Rhine) on 05/27/2020 11:58:12 PM   Radiology No results found.  Procedures Procedures   Medications Ordered in ED Medications  ondansetron (ZOFRAN) injection 4 mg (4 mg Intravenous Given 05/27/20 2251)  alum & mag hydroxide-simeth (MAALOX/MYLANTA) 200-200-20 MG/5ML suspension 30 mL (30 mLs Oral Given 05/27/20 2334)    ED Course  I have reviewed the triage vital signs and the nursing  notes.  Pertinent labs   results that were available during my care of the patient were reviewed by me and considered in my medical decision making (see chart for details).    MDM Rules/Calculators/A&P                          Patient well-appearing.  Exam is unremarkable.  Labs are overall reassuring and we discussed them in depth.  Patient feels comfortable for discharge home She had an ultrasound of her gallbladder less than a year ago that was negative.  It sounds like she had a HIDA scan several years ago at an outside hospital.  This may need to be repeated as she could have an gallbladder functional disorder.  At this time no signs of acute abdominal emergency.  Final Clinical Impression(s) / ED Diagnoses Final diagnoses:  RUQ  pain    Rx / DC Orders ED Discharge Orders    None       Zadie Rhine, MD 05/28/20 [phone]

## 2020-11-20 ENCOUNTER — Ambulatory Visit (INDEPENDENT_AMBULATORY_CARE_PROVIDER_SITE_OTHER): Payer: 59 | Admitting: Obstetrics & Gynecology

## 2020-11-20 ENCOUNTER — Other Ambulatory Visit (HOSPITAL_COMMUNITY)
Admission: RE | Admit: 2020-11-20 | Discharge: 2020-11-20 | Disposition: A | Discharge status: Home / Self Care | Payer: 59 | Source: Ambulatory Visit | Attending: Obstetrics & Gynecology | Admitting: Obstetrics & Gynecology

## 2020-11-20 ENCOUNTER — Telehealth: Payer: Self-pay | Admitting: General Practice

## 2020-11-20 ENCOUNTER — Encounter: Payer: Self-pay | Admitting: Obstetrics & Gynecology

## 2020-11-20 ENCOUNTER — Other Ambulatory Visit: Payer: Self-pay

## 2020-11-20 VITALS — BP 126/81 | HR 70 | Ht 64.5 in | Wt 173.0 lb

## 2020-11-20 DIAGNOSIS — Z01419 Encounter for gynecological examination (general) (routine) without abnormal findings: Secondary | ICD-10-CM

## 2020-11-20 DIAGNOSIS — Z3009 Encounter for other general counseling and advice on contraception: Secondary | ICD-10-CM

## 2020-11-20 DIAGNOSIS — Z1231 Encounter for screening mammogram for malignant neoplasm of breast: Secondary | ICD-10-CM

## 2020-11-20 MED ORDER — NORETHINDRONE ACET-ETHINYL EST 1-20 MG-MCG PO TABS: 1.0000 | ORAL_TABLET | Freq: Every day | ORAL | 11 refills | Status: DC

## 2020-11-20 NOTE — Patient Instructions (Signed)
Oral Contraception Use Oral contraceptive pills (OCPs) are medicines that prevent pregnancy. OCPs work by: Preventing the ovaries from releasing eggs. Thickening mucus in the lower part of the uterus (cervix). This prevents sperm from entering the uterus. Thinning the lining of the uterus (endometrium). This prevents a fertilized egg from attaching to the endometrium. Discuss possible side effects of OCPs with your health care provider. It cantake 2-3 months for your body to adjust to changes in hormone levels. What are the risks? OCPs can sometimes cause side effects, such as: Headache. Depression. Trouble sleeping. Nausea and vomiting. Breast tenderness. Irregular bleeding or spotting during the first several months. Bloating or fluid retention. Increase in blood pressure. OCPs with estrogen and progestins may slightly increase the risk of: Blood clots. Heart attack. Stroke. How to take OCPs Follow instructions from your health care provider about how to take your first cycle of OCPs. There are 2 types of OCPs. The first, combination OCPs, have both estrogen and progestins. The second, progestin-only pills, have only progestin. For combination OCPs, you may start the pill: On day 1 of your menstrual period. On the first Sunday after your period starts, or on the day you get your prescription. At any time of your cycle. If you start taking the pill within 5 days after the start of your period, you will not need a backup form of birth control, such as condoms. If you start at any other time of your menstrual cycle, you will need to use a backup form of birth control. For progestin-only OCPs: Ideally, you can start taking the pill on the first day of your menstrual period, but you can start it on any other day too. These pills will protect you from pregnancy after taking it for 2 days (48 hours). You can stop using a backup form of birth control after that time. It is important that you  take this pill at the same time every day. Even taking it 3 hours late can increase the risk of pregnancy. No matter which day you start the OCP, you will always start a new pack on that same day of the week. Have an extra pack of OCPs and a backup contraceptivemethod available in case you miss some pills or lose your OCP pack. Missed doses Follow instructions from your health care provider for missed doses. Information about missed doses can also be found in the patient information sheet that comes with your pack of pills. In general, for combined OCPs: If you forget to take the pill for 1 day, take it as soon as you can. This may mean taking 2 pills on the same day and at the same time. Take the next day's pill at the regular time. If you forget to take the pill for 2 days in a row, take 2 tablets on the day you remember and 2 tablets on the following day. A backup form of birth control should be used for 7 days after you are back on schedule. If you forget to take the pill for 3 days in a row, call your health care provider for directions on when to restart taking your pills. Do not take the missed pills. A backup form of birth control will be needed for 7 days once you restart your pills. If you use a pack that contains inactive pills and you miss 1 or more of the inactive pills, you do not need to take the missed doses. Skip them and start the new pack on   the regular day. For progestin-only OCPs: If your dose is 3 hours or more late, or if you miss 1 or more doses, take 1 missed pill as soon as you can. If you miss one or more doses, you must use a backup form of birth control. Some brands of progestin-only pills recommend using a backup form of birth control for 48 hours after a missed or late dose while others recommend 7 days. If you are not sure what to do, call your health care provider or check the patient information sheet that came with your pills. Follow these instructions at home: Do not  use any products that contain nicotine or tobacco. These include cigarettes, chewing tobacco, or vaping devices, such as e-cigarettes. If you need help quitting, ask your health care provider. Always use a condom to protect against STIs (sexually transmitted infections). Oral contraception pills do not protect against STIs. Use a calendar to mark the days of your menstrual period. Read the information sheet and directions that came with your OCP. Talk to your health care provider if you have questions. Contact a health care provider if: You develop nausea and vomiting. You have abnormal vaginal discharge or bleeding. You develop a rash. You miss your menstrual period. Depending on the type of OCP you are taking, this may be a sign of pregnancy. You are losing your hair. You need treatment for mood swings or depression. You get dizzy when taking the OCP. You develop acne after taking the OCP. You become pregnant or think you may be pregnant. You have diarrhea, constipation, and abdominal pain or cramps. You are not sure what to do after missing pills. Get help right away if: You develop chest pain. You develop shortness of breath. You have an uncontrolled or severe headache. You develop numbness or slurred speech. You develop vision or speech problems. You develop pain, redness, and swelling in your legs. You develop weakness or numbness in your arms or legs. These symptoms may represent a serious problem that is an emergency. Do not wait to see if the symptoms will go away. Get medical help right away. Call your local emergency services (911 in the [place] Do not drive yourself to the hospital. Summary Oral contraceptive pills (OCPs) are medicines that you take to prevent pregnancy. OCPs do not prevent sexually transmitted infections (STIs). Always use a condom to protect against STIs. When you start an OCP, be aware that it can take 2-3 months for your body to adjust to changes in hormone  levels. Read all the information and directions that come with your OCP. This information is not intended to replace advice given to you by your health care provider. Make sure you discuss any questions you have with your healthcare provider. Document Revised: 09/23/2019 Document Reviewed: 09/23/2019 Elsevier Patient Education  2022 [employer]  

## 2020-11-20 NOTE — Telephone Encounter (Signed)
Called patient to inform her of Mammogram scheduled on 01/02/2021 at 10:00am here at the [facility] in [address].  Unable to reach patient and was unable to leave a message on voice mail due to mailbox was full.  Will send a Mychart message.

## 2020-11-20 NOTE — Progress Notes (Signed)
Subjective:     Aimee Escobar is a 40 y.o. female here for a routine exam.  Current complaints: Pt recently separated from her husband who'd had a vasectomy. She now requests contraception counseling.  She reports that she is not currently sexually active. She has no concerns for STIs. She reports heavy menses.    Gynecologic History Patient's last menstrual period was 11/07/2020 (exact date). Contraception: abstinence Last Pap: >3 years prev. Results were: normal Last mammogram: n/a.  Obstetric History OB History  Gravida Para Term Preterm AB Living  3 3 2 1   3   SAB IAB Ectopic Multiple Live Births          3    # Outcome Date GA Lbr Len/2nd Weight Sex Delivery Anes PTL Lv  3 Term      Vag-Spont     2 Term      Vag-Spont     1 Preterm      Vag-Spont        The following portions of the patient's history were reviewed and updated as appropriate: allergies, current medications, past family history, past medical history, past social history, past surgical history, and problem list.  Review of Systems Pertinent items are noted in HPI.    Objective:  BP 126/81   Pulse 70   Ht 5' 4.5" (1.638 m)   Wt 173 lb (78.5 kg)   LMP 11/07/2020 (Exact Date)   BMI 29.24 kg/m  General Appearance:    Alert, cooperative, no distress, appears stated age  Head:    Normocephalic, without obvious abnormality, atraumatic  Eyes:    conjunctiva/corneas clear, EOM's intact, both eyes  Ears:    Normal external ear canals, both ears  Nose:   Nares normal, septum midline, mucosa normal, no drainage    or sinus tenderness  Throat:   Lips, mucosa, and tongue normal; teeth and gums normal  Neck:   Supple, symmetrical, trachea midline, no adenopathy;    thyroid:  no enlargement/tenderness/nodules  Back:     Symmetric, no curvature, ROM normal, no CVA tenderness  Lungs:     respirations unlabored  Chest Wall:    No tenderness or deformity   Heart:    Regular rate and rhythm  Breast Exam:    No  tenderness, masses, or nipple abnormality  Abdomen:     Soft, non-tender, bowel sounds active all four quadrants,    no masses, no organomegaly  Genitalia:    Normal female without lesion, discharge or tenderness     Extremities:   Extremities normal, atraumatic, no cyanosis or edema  Pulses:   2+ and symmetric all extremities  Skin:   Skin color, texture, turgor normal, no rashes or lesions     Assessment:    Healthy female exam.   Reviewed all forms of birth control options available including abstinence; over the counter/barrier methods; hormonal contraceptive medication including pill, patch, ring, injection,contraceptive implant; hormonal and nonhormonal IUDs; permanent sterilization options including vasectomy and the various tubal sterilization modalities. Risks and benefits reviewed.  Questions were answered.  Information was given to patient to review.     Plan:   [name] was seen today for gynecologic exam.  Diagnoses and all orders for this visit:  Well female exam with routine gynecological exam -     Cytology - PAP( [place])  Breast cancer screening by mammogram -     Cancel: [facility] Digital Screening; Future -     MM 3D SCREEN BREAST BILATERAL;  Future  Encounter for counseling regarding contraception -     norethindrone-ethinyl estradiol (LOESTRIN) 1-20 MG-MCG tablet; Take 1 tablet by mouth daily.   Pt will f/u if she is not able to be complaint with daily pill use.   F/u in 3 months or sooner prn.  Kayen Grabel L. Harraway-Smith, M.D., [facility]

## 2020-11-20 NOTE — Progress Notes (Signed)
Patient reports heavy periods in the past.Patient would like to talk about birth control options- recently separated from husband Aimee Stammer RN

## 2020-11-22 LAB — CYTOLOGY - PAP
Comment: NEGATIVE
Diagnosis: NEGATIVE
High risk HPV: NEGATIVE

## 2020-11-26 ENCOUNTER — Encounter: Payer: Self-pay | Admitting: Obstetrics & Gynecology

## 2021-01-02 ENCOUNTER — Other Ambulatory Visit: Payer: Self-pay

## 2021-01-02 ENCOUNTER — Encounter (HOSPITAL_BASED_OUTPATIENT_CLINIC_OR_DEPARTMENT_OTHER): Payer: Self-pay

## 2021-01-02 ENCOUNTER — Ambulatory Visit (HOSPITAL_BASED_OUTPATIENT_CLINIC_OR_DEPARTMENT_OTHER)
Admission: RE | Admit: 2021-01-02 | Discharge: 2021-01-02 | Disposition: A | Discharge status: Home / Self Care | Payer: 59 | Source: Ambulatory Visit | Attending: Obstetrics & Gynecology | Admitting: Obstetrics & Gynecology

## 2021-01-02 DIAGNOSIS — Z1231 Encounter for screening mammogram for malignant neoplasm of breast: Secondary | ICD-10-CM | POA: Diagnosis present

## 2021-01-26 ENCOUNTER — Other Ambulatory Visit: Payer: Self-pay | Admitting: Obstetrics & Gynecology

## 2021-01-26 DIAGNOSIS — R928 Other abnormal and inconclusive findings on diagnostic imaging of breast: Secondary | ICD-10-CM

## 2021-02-14 ENCOUNTER — Ambulatory Visit: Payer: Medicaid Other | Admitting: Obstetrics & Gynecology

## 2021-02-21 ENCOUNTER — Ambulatory Visit: Payer: 59 | Admitting: Obstetrics & Gynecology

## 2021-03-06 ENCOUNTER — Ambulatory Visit
Admission: RE | Admit: 2021-03-06 | Discharge: 2021-03-06 | Disposition: A | Discharge status: Home / Self Care | Payer: 59 | Source: Ambulatory Visit | Attending: Obstetrics & Gynecology | Admitting: Obstetrics & Gynecology

## 2021-03-06 ENCOUNTER — Other Ambulatory Visit: Payer: Self-pay | Admitting: Obstetrics & Gynecology

## 2021-03-06 ENCOUNTER — Ambulatory Visit
Admission: RE | Admit: 2021-03-06 | Discharge: 2021-03-06 | Disposition: A | Discharge status: Home / Self Care | Payer: Managed Care, Other (non HMO) | Source: Ambulatory Visit | Attending: Obstetrics & Gynecology | Admitting: Obstetrics & Gynecology

## 2021-03-06 ENCOUNTER — Other Ambulatory Visit: Payer: Self-pay

## 2021-03-06 DIAGNOSIS — N6310 Unspecified lump in the right breast, unspecified quadrant: Secondary | ICD-10-CM

## 2021-03-06 DIAGNOSIS — R928 Other abnormal and inconclusive findings on diagnostic imaging of breast: Secondary | ICD-10-CM

## 2021-03-20 ENCOUNTER — Other Ambulatory Visit: Payer: Managed Care, Other (non HMO)

## 2021-03-22 ENCOUNTER — Other Ambulatory Visit: Payer: Self-pay | Admitting: Obstetrics & Gynecology

## 2021-03-22 DIAGNOSIS — N6310 Unspecified lump in the right breast, unspecified quadrant: Secondary | ICD-10-CM

## 2021-04-05 ENCOUNTER — Inpatient Hospital Stay: Admission: RE | Admit: 2021-04-05 | Payer: Managed Care, Other (non HMO) | Source: Ambulatory Visit

## 2021-04-19 ENCOUNTER — Ambulatory Visit
Admission: RE | Admit: 2021-04-19 | Discharge: 2021-04-19 | Disposition: A | Discharge status: Home / Self Care | Payer: Managed Care, Other (non HMO) | Source: Ambulatory Visit | Attending: Obstetrics & Gynecology | Admitting: Obstetrics & Gynecology

## 2021-04-19 DIAGNOSIS — N6310 Unspecified lump in the right breast, unspecified quadrant: Secondary | ICD-10-CM

## 2021-05-23 IMAGING — CT CT RENAL STONE PROTOCOL
2 of 4 series · 17 of 46 positions shown, 19 images · non-contrast
Comparison: None.

CLINICAL DATA: Right-sided flank pain with kidney stone suspected

EXAM:
CT ABDOMEN AND PELVIS WITHOUT CONTRAST
TECHNIQUE: Multidetector CT imaging of the abdomen and pelvis was performed
following the standard protocol without IV contrast.

[Series 2: axial st · axial · 0.98mm/px · z∈[-517,-87]mm · 14 of 94 slices shown, 16 images]
[im 4/94  soft-tissue]
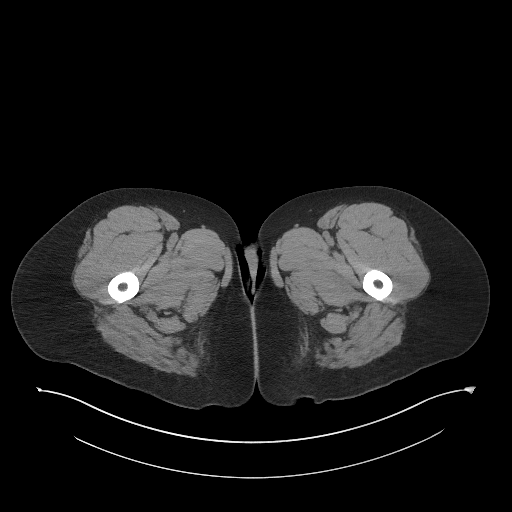
[im 4/94  bone]
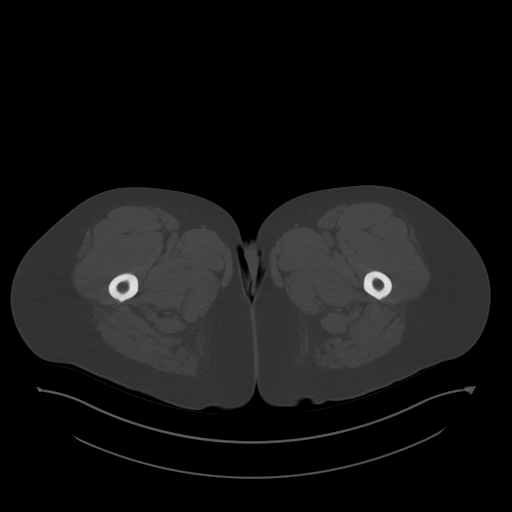
[im 12/94  soft-tissue]
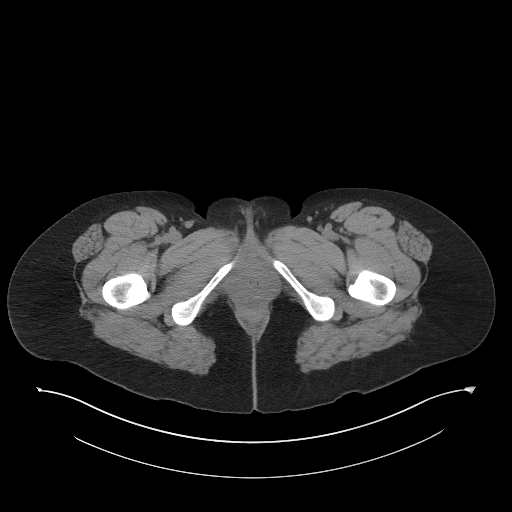
[im 19/94  soft-tissue]
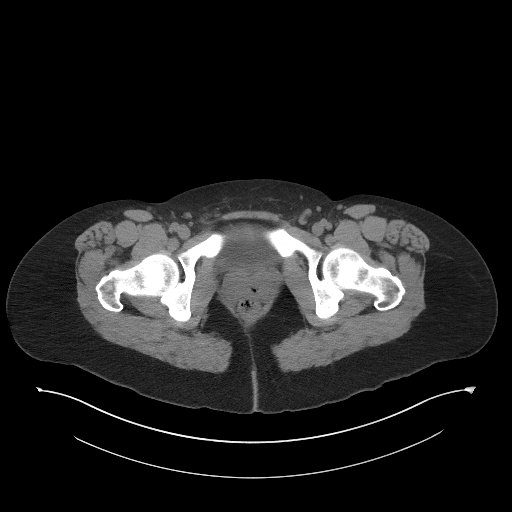
[im 27/94  soft-tissue]
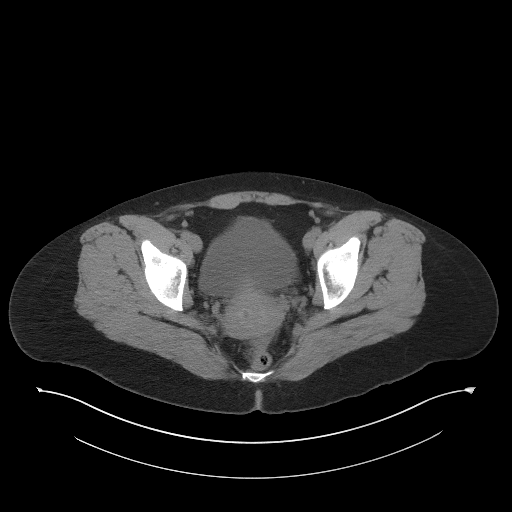
[im 30/94  soft-tissue]
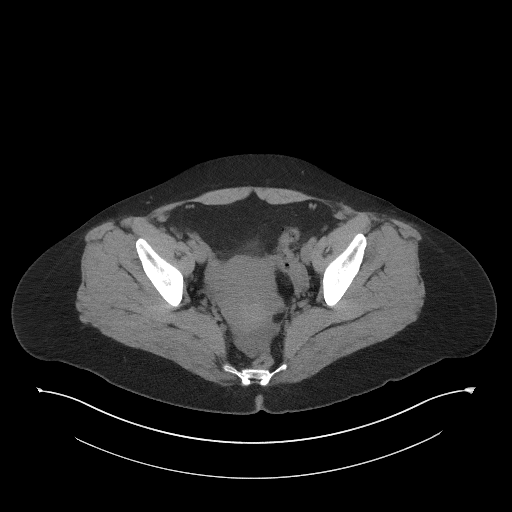
[im 38/94  soft-tissue]
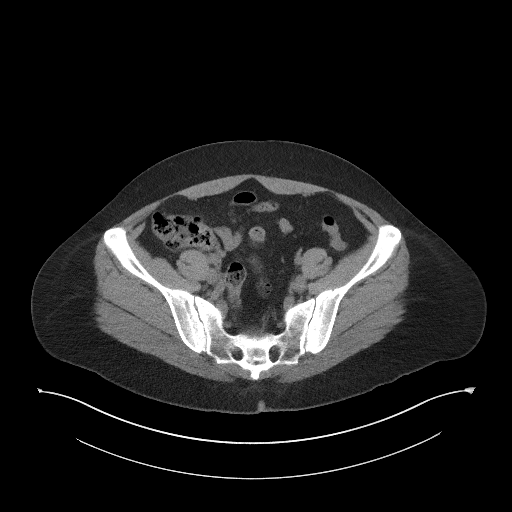
[im 45/94  soft-tissue]
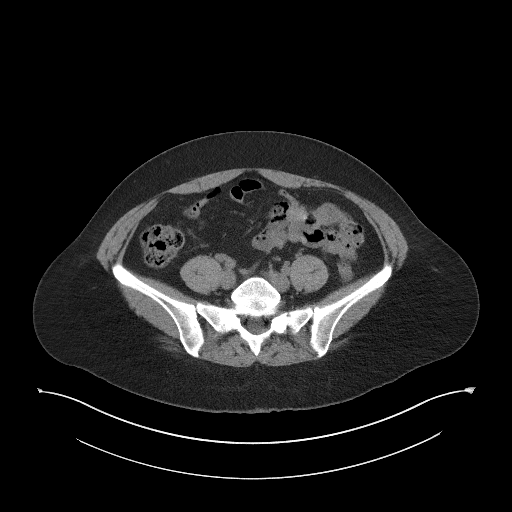
[im 49/94  soft-tissue]
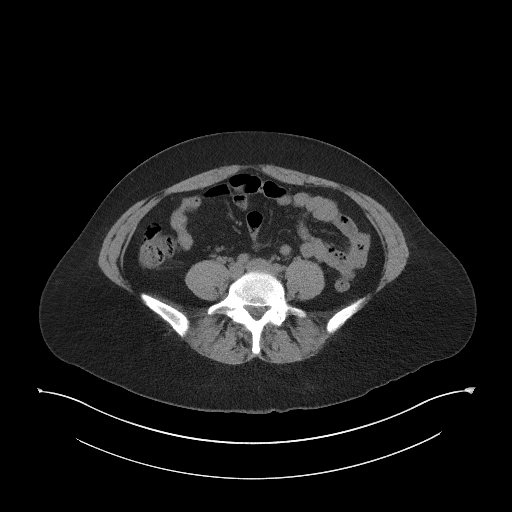
[im 56/94  soft-tissue]
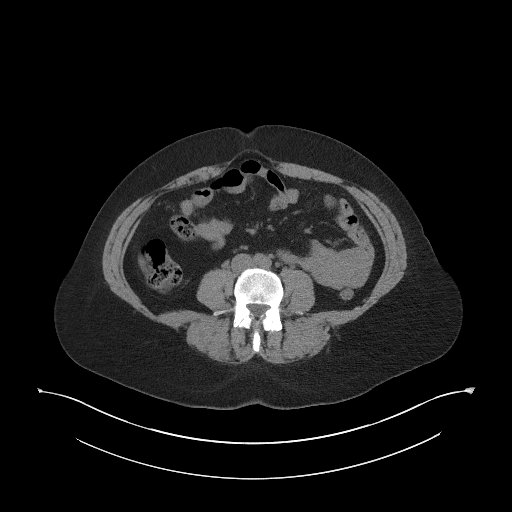
[im 56/94  bone]
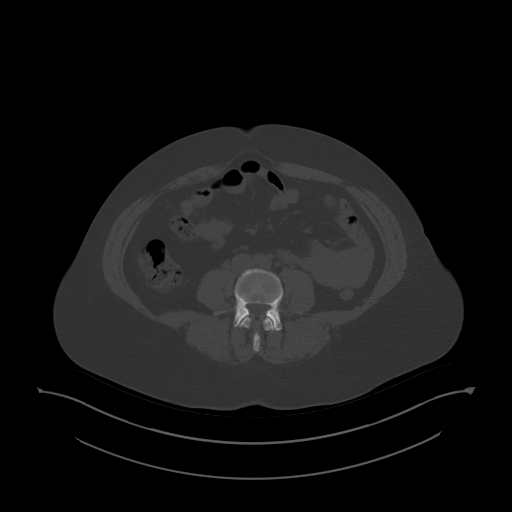
[im 64/94  soft-tissue]
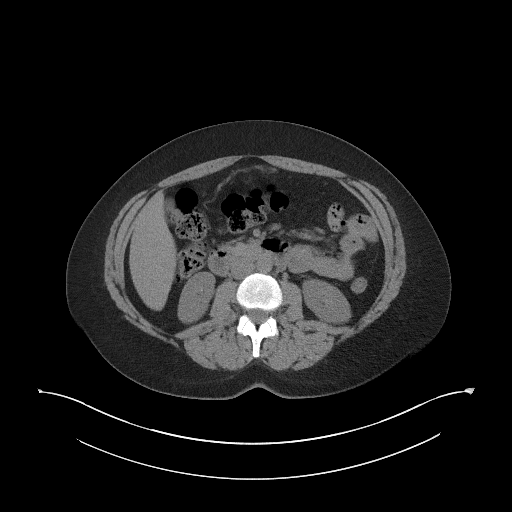
[im 71/94  soft-tissue]
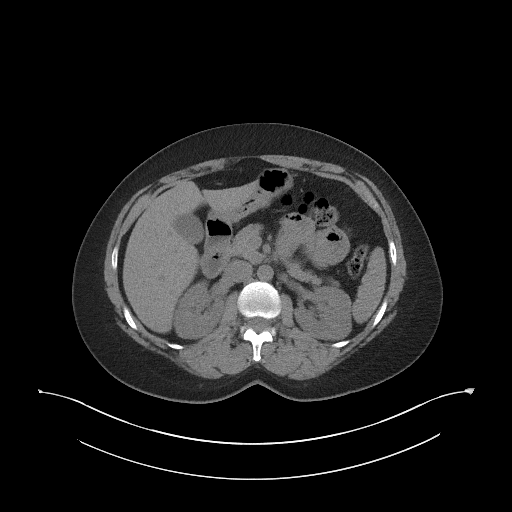
[im 75/94  soft-tissue]
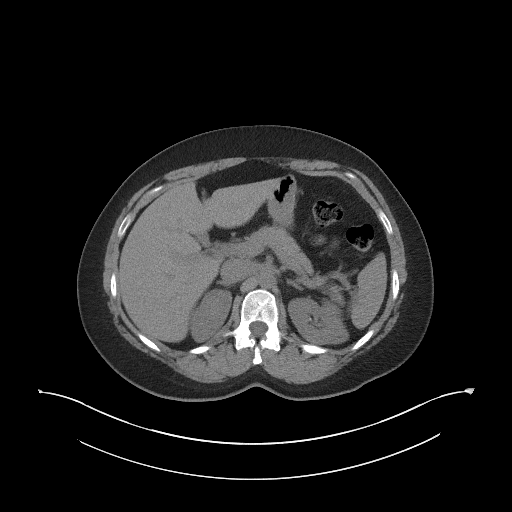
[im 82/94  soft-tissue]
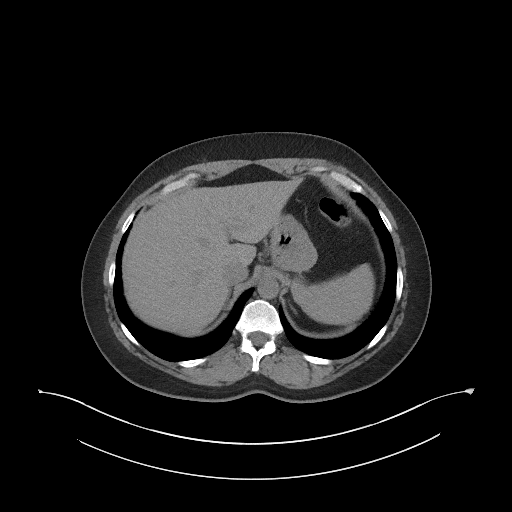
[im 90/94  soft-tissue]
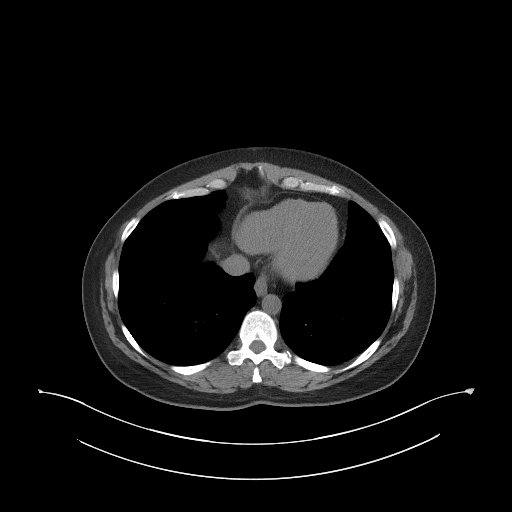

[Series 5: coronal st · coronal · 0.96mm/px · 3 of 97 slices shown]
[im 33/97  soft-tissue]
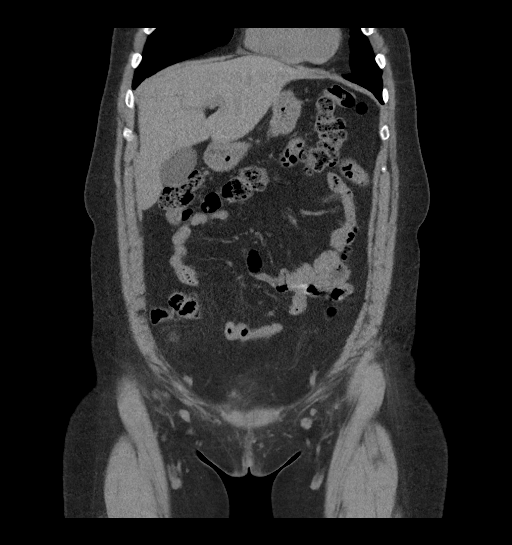
[im 43/97  soft-tissue]
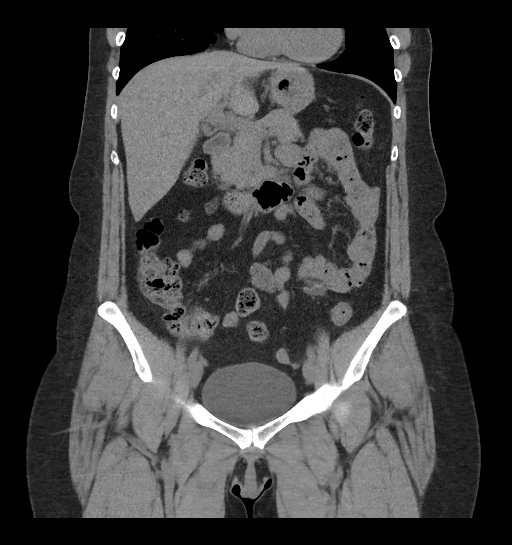
[im 54/97  soft-tissue]
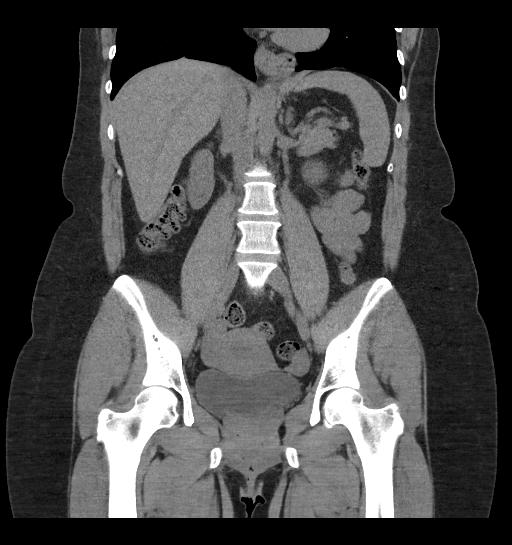

[17 of 46 positions shown; findings below may reference images not displayed]

FINDINGS: Lower chest:  Small sliding hiatal hernia

Hepatobiliary: No focal liver abnormality.No evidence of biliary
obstruction or stone.

Pancreas: Unremarkable.

Spleen: Unremarkable.

Adrenals/Urinary Tract: Negative adrenals. At least 4 small right
renal calculi measuring up to 2 mm at the lower pole. Punctate left
upper pole calculus. No hydronephrosis. Unremarkable bladder.

Stomach/Bowel:  No obstruction. No appendicitis.

Vascular/Lymphatic: No acute vascular abnormality. No mass or
adenopathy.

Reproductive:No pathologic findings. Mild abdominal wall bulging at
the umbilicus without discrete hernia

Other: Trace pelvic fluid, usually physiologic with patient's
demographics

Musculoskeletal: No acute abnormalities.
IMPRESSION: Nephrolithiasis without hydronephrosis or ureteral calculus.

Trace pelvic fluid, usually physiologic.

## 2022-07-06 ENCOUNTER — Emergency Department (HOSPITAL_BASED_OUTPATIENT_CLINIC_OR_DEPARTMENT_OTHER)
Admission: EM | Admit: 2022-07-06 | Discharge: 2022-07-06 | Disposition: A | Discharge status: Home / Self Care | Payer: Managed Care, Other (non HMO) | Attending: Emergency Medicine | Admitting: Emergency Medicine

## 2022-07-06 ENCOUNTER — Encounter (HOSPITAL_BASED_OUTPATIENT_CLINIC_OR_DEPARTMENT_OTHER): Payer: Self-pay

## 2022-07-06 ENCOUNTER — Other Ambulatory Visit: Payer: Self-pay

## 2022-07-06 DIAGNOSIS — R350 Frequency of micturition: Secondary | ICD-10-CM | POA: Diagnosis present

## 2022-07-06 DIAGNOSIS — M549 Dorsalgia, unspecified: Secondary | ICD-10-CM | POA: Diagnosis not present

## 2022-07-06 LAB — URINALYSIS, W/ REFLEX TO CULTURE (INFECTION SUSPECTED)
Bacteria, UA: NONE SEEN
Bilirubin Urine: NEGATIVE
Glucose, UA: NEGATIVE mg/dL
Hgb urine dipstick: NEGATIVE
Ketones, ur: NEGATIVE mg/dL
Leukocytes,Ua: NEGATIVE
Nitrite: NEGATIVE
Protein, ur: NEGATIVE mg/dL
RBC / HPF: NONE SEEN RBC/hpf (ref 0–5)
Specific Gravity, Urine: 1.01 (ref 1.005–1.030)
WBC, UA: NONE SEEN WBC/hpf (ref 0–5)
pH: 6 (ref 5.0–8.0)

## 2022-07-06 LAB — BASIC METABOLIC PANEL
Anion gap: 8 (ref 5–15)
BUN: 20 mg/dL (ref 6–20)
CO2: 25 mmol/L (ref 22–32)
Calcium: 8.7 mg/dL — ABNORMAL LOW (ref 8.9–10.3)
Chloride: 103 mmol/L (ref 98–111)
Creatinine, Ser: 0.85 mg/dL (ref 0.44–1.00)
GFR, Estimated: >60 mL/min (ref >60)
Glucose, Bld: 121 mg/dL — ABNORMAL HIGH (ref 70–99)
Potassium: 3.7 mmol/L (ref 3.5–5.1)
Sodium: 136 mmol/L (ref 135–145)

## 2022-07-06 LAB — CBC
HCT: 34.2 % — ABNORMAL LOW (ref 36.0–46.0)
Hemoglobin: 11.3 g/dL — ABNORMAL LOW (ref 12.0–15.0)
MCH: 30.1 pg (ref 26.0–34.0)
MCHC: 33 g/dL (ref 30.0–36.0)
MCV: 91.2 fL (ref 80.0–100.0)
Platelets: 312 10*3/uL (ref 150–400)
RBC: 3.75 MIL/uL — ABNORMAL LOW (ref 3.87–5.11)
RDW: 13.2 % (ref 11.5–15.5)
WBC: 4.6 10*3/uL (ref 4.0–10.5)
nRBC: 0 % (ref 0.0–0.2)

## 2022-07-06 LAB — PREGNANCY, URINE: Preg Test, Ur: NEGATIVE

## 2022-07-06 MED ORDER — CIPROFLOXACIN HCL 500 MG PO TABS: 500.0000 mg | ORAL_TABLET | Freq: Two times a day (BID) | ORAL | 0 refills | Status: DC

## 2022-07-06 MED ORDER — CIPROFLOXACIN HCL 500 MG PO TABS
500.0000 mg | ORAL_TABLET | Freq: Once | ORAL | Status: AC
  Administered 2022-07-06: 500 mg via ORAL
  Filled 2022-07-06: qty 1

## 2022-07-06 NOTE — ED Triage Notes (Signed)
Pt 3 weeks postpartum. Recent hospitalization for UTI. Recently finished cipro on 5/27. Pain and burning with urination. Pt reports pain and lethargy as well.

## 2022-07-06 NOTE — ED Provider Notes (Signed)
[facility] [facility] Provider Note   CSN: [id number] Arrival date & time: 07/06/22  0146     History  Chief Complaint  Patient presents with   Urinary Frequency    Aimee Escobar is a 42 y.o. female.  Patient is a 42 year old female with past medical history of recent childbirth with complicated postdelivery course.  She was apparently admitted over at [facility] for endometritis/urinary tract infection.  Patient was in the hospital for several days, then discharged on Cipro for a positive urine culture.  She finished taking this antibiotic 1 week ago.  She is now experiencing burning when she urinates along with frequency and back pain.  She feels as though her UTI is coming back.  She denies to me she is having any fevers.  She denies any bowel complaints.  The history is provided by the patient.       Home Medications Prior to Admission medications   Medication Sig Start Date End Date Taking? Authorizing Provider  Ascorbic Acid (VITAMIN C PO) Take by mouth. Patient not taking: Reported on 11/20/2020    [provider]  clonazePAM (KLONOPIN) 0.5 MG tablet Take 1 tablet by mouth as needed. Patient not taking: Reported on 11/20/2020 08/21/15   [provider]  Cyanocobalamin (VITAMIN B-12 PO) Take by mouth as needed. Patient not taking: Reported on 11/20/2020    [provider]  Ferrous Sulfate (IRON PO) Take by mouth as needed. Patient not taking: Reported on 11/20/2020    [provider]  MILK THISTLE PO Take 1 tablet by mouth daily. Patient not taking: Reported on 11/20/2020    [provider]  Multiple Vitamins-Minerals (ZINC PO) Take by mouth daily. 11 pumps daily Patient not taking: Reported on 11/20/2020    [provider]  naproxen (NAPROSYN) 250 MG tablet Take 2 tablets (500 mg total) by mouth 2 (two) times daily with a meal. Patient not taking: Reported on 11/20/2020 08/11/19    Caccavale, Sophia, PA-C  norethindrone-ethinyl estradiol (LOESTRIN) 1-20 MG-MCG tablet Take 1 tablet by mouth daily. 11/20/20   Willodean Rosenthal, MD  pantoprazole (PROTONIX) 40 MG tablet Take 1 tablet (40 mg total) by mouth daily. 08/19/19 09/18/19  Lynann Bologna, MD  Vitamin D-Vitamin K (VITAMIN K2-VITAMIN D3 PO) Take by mouth. Patient not taking: Reported on 11/20/2020    [provider]      Allergies    Gadolinium derivatives, Other, and Tomato    Review of Systems   Review of Systems  All other systems reviewed and are negative.   Physical Exam Updated Vital Signs BP 125/81   Pulse 69   Temp 97.6 F (36.4 C) (Oral)   Resp 17   Ht 5' 4.5" (1.638 m)   Wt 72.6 kg   SpO2 99%   Breastfeeding Yes   BMI 27.04 kg/m  Physical Exam Vitals and nursing note reviewed.  Constitutional:      General: She is not in acute distress.    Appearance: She is well-developed. She is not diaphoretic.  HENT:     Head: Normocephalic and atraumatic.  Cardiovascular:     Rate and Rhythm: Normal rate and regular rhythm.     Heart sounds: No murmur heard.    No friction rub. No gallop.  Pulmonary:     Effort: Pulmonary effort is normal. No respiratory distress.     Breath sounds: Normal breath sounds. No wheezing.  Abdominal:     General: Bowel sounds  are normal. There is no distension.     Palpations: Abdomen is soft.     Tenderness: There is no abdominal tenderness.  Musculoskeletal:        General: Normal range of motion.     Cervical back: Normal range of motion and neck supple.  Skin:    General: Skin is warm and dry.  Neurological:     General: No focal deficit present.     Mental Status: She is alert and oriented to person, place, and time.     ED Results / Procedures / Treatments   Labs (all labs ordered are listed, but only abnormal results are displayed) Labs Reviewed  BASIC METABOLIC PANEL - Abnormal; Notable for the following components:      Result Value    Glucose, Bld 121 (*)    Calcium 8.7 (*)    All other components within normal limits  CBC - Abnormal; Notable for the following components:   RBC 3.75 (*)    Hemoglobin 11.3 (*)    HCT 34.2 (*)    All other components within normal limits  PREGNANCY, URINE  URINALYSIS, W/ REFLEX TO CULTURE (INFECTION SUSPECTED)    EKG None  Radiology No results found.  Procedures Procedures    Medications Ordered in ED Medications  ciprofloxacin (CIPRO) tablet 500 mg (has no administration in time range)    ED Course/ Medical Decision Making/ A&P  Patient is a 42 year old female with past medical history as per HPI.  Patient presenting with complaints of urinary frequency and back discomfort.  She was recently admitted following childbirth for endometritis and UTI.  She finished taking Cipro 1 week ago, but now symptoms seem to be coming back.  Patient arrives here afebrile with stable vital signs.  She is nontoxic and clinically well-appearing.  Physical examination basically unremarkable.  Laboratory studies obtained including CBC and metabolic panel.  All studies unremarkable.  Urinalysis shows no evidence for infection.  Culture also ordered.  She appears clinically well and laboratory studies are reassuring.  Urinalysis not consistent with UTI, but due to recent UTI presenting in the same fashion, I will restart her on Cipro as she was on this previously.  A culture of her urine has been obtained and is currently pending.  If this culture returns positive.  Therapy will be adjusted based on culture and sensitivities.  Final Clinical Impression(s) / ED Diagnoses Final diagnoses:  None    Rx / DC Orders ED Discharge Orders     None         Geoffery Lyons, MD 07/06/22 [phone]

## 2022-07-06 NOTE — Discharge Instructions (Signed)
Begin taking Cipro as prescribed.  We will call you if your culture indicates we need to alter your antibiotic therapy.  Follow-up with your GYN next week, and return to the ER if symptoms significantly worsen or change.

## 2022-07-07 LAB — URINE CULTURE: Culture: NO GROWTH

## 2022-07-13 ENCOUNTER — Encounter (HOSPITAL_BASED_OUTPATIENT_CLINIC_OR_DEPARTMENT_OTHER): Payer: Self-pay | Admitting: Emergency Medicine

## 2022-07-13 ENCOUNTER — Other Ambulatory Visit: Payer: Self-pay

## 2022-07-13 ENCOUNTER — Emergency Department (HOSPITAL_BASED_OUTPATIENT_CLINIC_OR_DEPARTMENT_OTHER)
Admission: EM | Admit: 2022-07-13 | Discharge: 2022-07-13 | Disposition: A | Discharge status: Home / Self Care | Payer: Managed Care, Other (non HMO) | Attending: Emergency Medicine | Admitting: Emergency Medicine

## 2022-07-13 ENCOUNTER — Telehealth (HOSPITAL_BASED_OUTPATIENT_CLINIC_OR_DEPARTMENT_OTHER): Payer: Self-pay | Admitting: Emergency Medicine

## 2022-07-13 ENCOUNTER — Emergency Department (HOSPITAL_BASED_OUTPATIENT_CLINIC_OR_DEPARTMENT_OTHER): Payer: Managed Care, Other (non HMO)

## 2022-07-13 DIAGNOSIS — R519 Headache, unspecified: Secondary | ICD-10-CM | POA: Diagnosis not present

## 2022-07-13 DIAGNOSIS — R7989 Other specified abnormal findings of blood chemistry: Secondary | ICD-10-CM | POA: Diagnosis not present

## 2022-07-13 DIAGNOSIS — R197 Diarrhea, unspecified: Secondary | ICD-10-CM | POA: Diagnosis not present

## 2022-07-13 DIAGNOSIS — R5383 Other fatigue: Secondary | ICD-10-CM | POA: Insufficient documentation

## 2022-07-13 DIAGNOSIS — A0472 Enterocolitis due to Clostridium difficile, not specified as recurrent: Secondary | ICD-10-CM

## 2022-07-13 DIAGNOSIS — R1084 Generalized abdominal pain: Secondary | ICD-10-CM | POA: Insufficient documentation

## 2022-07-13 DIAGNOSIS — R0789 Other chest pain: Secondary | ICD-10-CM | POA: Diagnosis not present

## 2022-07-13 DIAGNOSIS — R110 Nausea: Secondary | ICD-10-CM | POA: Diagnosis not present

## 2022-07-13 LAB — CBC
HCT: 41 % (ref 36.0–46.0)
Hemoglobin: 13.4 g/dL (ref 12.0–15.0)
MCH: 30.1 pg (ref 26.0–34.0)
MCHC: 32.7 g/dL (ref 30.0–36.0)
MCV: 92.1 fL (ref 80.0–100.0)
Platelets: 258 10*3/uL (ref 150–400)
RBC: 4.45 MIL/uL (ref 3.87–5.11)
RDW: 13.1 % (ref 11.5–15.5)
WBC: 7.4 10*3/uL (ref 4.0–10.5)
nRBC: 0 % (ref 0.0–0.2)

## 2022-07-13 LAB — COMPREHENSIVE METABOLIC PANEL
ALT: 30 U/L (ref 0–44)
AST: 29 U/L (ref 15–41)
Albumin: 4 g/dL (ref 3.5–5.0)
Alkaline Phosphatase: 57 U/L (ref 38–126)
Anion gap: 12 (ref 5–15)
BUN: 12 mg/dL (ref 6–20)
CO2: 26 mmol/L (ref 22–32)
Calcium: 8.9 mg/dL (ref 8.9–10.3)
Chloride: 101 mmol/L (ref 98–111)
Creatinine, Ser: 1.01 mg/dL — ABNORMAL HIGH (ref 0.44–1.00)
GFR, Estimated: >60 mL/min (ref >60)
Glucose, Bld: 102 mg/dL — ABNORMAL HIGH (ref 70–99)
Potassium: 3.6 mmol/L (ref 3.5–5.1)
Sodium: 139 mmol/L (ref 135–145)
Total Bilirubin: 0.7 mg/dL (ref 0.3–1.2)
Total Protein: 7.5 g/dL (ref 6.5–8.1)

## 2022-07-13 LAB — URINALYSIS, ROUTINE W REFLEX MICROSCOPIC
Bilirubin Urine: NEGATIVE
Glucose, UA: NEGATIVE mg/dL
Ketones, ur: NEGATIVE mg/dL
Leukocytes,Ua: NEGATIVE
Nitrite: NEGATIVE
Protein, ur: NEGATIVE mg/dL
Specific Gravity, Urine: 1.02 (ref 1.005–1.030)
pH: 5.5 (ref 5.0–8.0)

## 2022-07-13 LAB — URINALYSIS, MICROSCOPIC (REFLEX)

## 2022-07-13 LAB — LIPASE, BLOOD: Lipase: 49 U/L (ref 11–51)

## 2022-07-13 LAB — C DIFFICILE QUICK SCREEN W PCR REFLEX
C Diff antigen: POSITIVE — AB
C Diff interpretation: DETECTED
C Diff toxin: POSITIVE — AB

## 2022-07-13 LAB — TROPONIN I (HIGH SENSITIVITY): Troponin I (High Sensitivity): <2 ng/L (ref <18)

## 2022-07-13 LAB — PREGNANCY, URINE: Preg Test, Ur: NEGATIVE

## 2022-07-13 MED ORDER — VANCOMYCIN HCL 125 MG PO CAPS: 125.0000 mg | ORAL_CAPSULE | Freq: Four times a day (QID) | ORAL | 0 refills | Status: DC

## 2022-07-13 MED ORDER — IOHEXOL 300 MG/ML  SOLN: 75.0000 mL | Freq: Once | INTRAMUSCULAR | Status: DC | PRN

## 2022-07-13 MED ORDER — ACETAMINOPHEN 500 MG PO TABS
1000.0000 mg | ORAL_TABLET | Freq: Once | ORAL | Status: AC
  Administered 2022-07-13: 1000 mg via ORAL
  Filled 2022-07-13: qty 2

## 2022-07-13 MED ORDER — SODIUM CHLORIDE 0.9 % IV BOLUS
1000.0000 mL | Freq: Once | INTRAVENOUS | Status: AC
  Administered 2022-07-13: 1000 mL via INTRAVENOUS

## 2022-07-13 MED ORDER — OXYCODONE-ACETAMINOPHEN 5-325 MG PO TABS
1.0000 | ORAL_TABLET | Freq: Once | ORAL | Status: AC
  Administered 2022-07-13: 1 via ORAL
  Filled 2022-07-13: qty 1

## 2022-07-13 MED ORDER — ONDANSETRON HCL 4 MG/2ML IJ SOLN
4.0000 mg | Freq: Once | INTRAMUSCULAR | Status: AC
  Administered 2022-07-13: 4 mg via INTRAVENOUS
  Filled 2022-07-13: qty 2

## 2022-07-13 NOTE — ED Triage Notes (Signed)
Pt treated with several antibiotics for UTI.  Pt is having severe diarrhea for last 3 days.  Pt also having abdominal cramping, general malaise and headache.

## 2022-07-13 NOTE — ED Notes (Signed)
+  Cdiff results relayed to O.Zelaya, PA.

## 2022-07-13 NOTE — ED Provider Notes (Signed)
[place] [facility] Provider Note   CSN: [id number] Arrival date & time: 07/13/22  1525     History  Chief Complaint  Patient presents with   Diarrhea    Aimee Escobar is a 42 y.o. female with a past medical history of anemia, who presents emergency department with concerns for diarrhea x 3 days.  Notes that her diarrhea has ranged from watery and soft with an average of 6-7 episodes per day.  Notes today she has only had 2 episodes of diarrhea today.  Has been treated multiple rounds of ciprofloxacin.  Has associated abdominal cramping, generalized fatigue, headache, nausea. No meds tried at home. Denies vomiting, urinary symptoms, fever.  Patient also with concerns for sternal chest wall pain that has been intermittent over the past week.  Denies recent injury, trauma, fall.  Denies chest pain worse with inspiration.  Denies shortness of breath. Denies PMHx of MI, DM, HTN, CAD, family history of MI in someone younger than age 32, stents.   Per pt chart review: Patient had a spontaneous vaginal delivery on 06/13/2022.  Patient was also diagnosed with acute cystitis on 06/19/2022.  Was diagnosed with postpartum endometritis on 06/17/2022.  The history is provided by the patient. No language interpreter was used.       Home Medications Prior to Admission medications   Medication Sig Start Date End Date Taking? Authorizing Provider  Ascorbic Acid (VITAMIN C PO) Take by mouth. Patient not taking: Reported on 11/20/2020    [provider]  ciprofloxacin (CIPRO) 500 MG tablet Take 1 tablet (500 mg total) by mouth 2 (two) times daily. One po bid x 7 days 07/06/22   Geoffery Lyons, MD  clonazePAM (KLONOPIN) 0.5 MG tablet Take 1 tablet by mouth as needed. Patient not taking: Reported on 11/20/2020 08/21/15   [provider]  Cyanocobalamin (VITAMIN B-12 PO) Take by mouth as needed. Patient not taking: Reported on 11/20/2020    [provider]  Ferrous Sulfate (IRON PO) Take by mouth as needed. Patient not taking: Reported on 11/20/2020    [provider]  MILK THISTLE PO Take 1 tablet by mouth daily. Patient not taking: Reported on 11/20/2020    [provider]  Multiple Vitamins-Minerals (ZINC PO) Take by mouth daily. 11 pumps daily Patient not taking: Reported on 11/20/2020    [provider]  naproxen (NAPROSYN) 250 MG tablet Take 2 tablets (500 mg total) by mouth 2 (two) times daily with a meal. Patient not taking: Reported on 11/20/2020 08/11/19   Caccavale, Sophia, PA-C  norethindrone-ethinyl estradiol (LOESTRIN) 1-20 MG-MCG tablet Take 1 tablet by mouth daily. 11/20/20   Willodean Rosenthal, MD  pantoprazole (PROTONIX) 40 MG tablet Take 1 tablet (40 mg total) by mouth daily. 08/19/19 09/18/19  Lynann Bologna, MD  Vitamin D-Vitamin K (VITAMIN K2-VITAMIN D3 PO) Take by mouth. Patient not taking: Reported on 11/20/2020    [provider]      Allergies    Gadolinium derivatives, Other, and Tomato    Review of Systems   Review of Systems  Gastrointestinal:  Positive for diarrhea.  All other systems reviewed and are negative.   Physical Exam Updated Vital Signs BP 110/83   Pulse 91   Temp 98.7 F (37.1 C) (Oral)   Resp 18   Ht 5\' 4"  (1.626 m)   Wt 72 kg   LMP 07/08/2022   SpO2 96%   Breastfeeding Yes   BMI 27.25  kg/m  Physical Exam Vitals and nursing note reviewed.  Constitutional:      General: She is not in acute distress.    Appearance: Normal appearance.  Eyes:     General: No scleral icterus.    Extraocular Movements: Extraocular movements intact.  Cardiovascular:     Rate and Rhythm: Normal rate.  Pulmonary:     Effort: Pulmonary effort is normal. No respiratory distress.  Abdominal:     Palpations: Abdomen is soft. There is no mass.     Tenderness: There is generalized abdominal tenderness.     Comments: Diffuse abdominal TTP.   Musculoskeletal:        General: Normal range of motion.     Cervical back: Neck supple.  Skin:    General: Skin is warm and dry.     Findings: No rash.  Neurological:     Mental Status: She is alert.     Sensory: Sensation is intact.     Motor: Motor function is intact.  Psychiatric:        Behavior: Behavior normal.     ED Results / Procedures / Treatments   Labs (all labs ordered are listed, but only abnormal results are displayed) Labs Reviewed  COMPREHENSIVE METABOLIC PANEL - Abnormal; Notable for the following components:      Result Value   Glucose, Bld 102 (*)    Creatinine, Ser 1.01 (*)    All other components within normal limits  URINALYSIS, ROUTINE W REFLEX MICROSCOPIC - Abnormal; Notable for the following components:   Hgb urine dipstick TRACE (*)    All other components within normal limits  URINALYSIS, MICROSCOPIC (REFLEX) - Abnormal; Notable for the following components:   Bacteria, UA FEW (*)    All other components within normal limits  C DIFFICILE QUICK SCREEN W PCR REFLEX    OVA + PARASITE EXAM  LIPASE, BLOOD  CBC  PREGNANCY, URINE  TROPONIN I (HIGH SENSITIVITY)    EKG None  Radiology DG Chest Portable 1 View  Result Date: 07/13/2022 CLINICAL DATA:  Chest wall pain for 1 week EXAM: PORTABLE CHEST 1 VIEW COMPARISON:  Chest x-ray 03/01/2020 FINDINGS: The heart size and mediastinal contours are within normal limits. Both lungs are clear. The visualized skeletal structures are unremarkable. IMPRESSION: No active disease. Electronically Signed   By: Darliss Cheney M.D.   On: 07/13/2022 18:17    Procedures Procedures    Medications Ordered in ED Medications  sodium chloride 0.9 % bolus 1,000 mL (1,000 mLs Intravenous New Bag/Given 07/13/22 1755)  ondansetron (ZOFRAN) injection 4 mg (4 mg Intravenous Given 07/13/22 1755)  acetaminophen (TYLENOL) tablet 1,000 mg (1,000 mg Oral Given 07/13/22 1755)    ED Course/ Medical Decision Making/ A&P                              Medical Decision Making Amount and/or Complexity of Data Reviewed Labs: ordered. Radiology: ordered.  Risk OTC drugs. Prescription drug management.   Pt presents with multiple concerns.  Patient voices concerns for intermittent vomiting/soft stools x 3 days after antibiotic use.  Notes multiple rounds of ciprofloxacin.  Patient afebrile, not tachycardic or hypoxic.  On exam patient with diffuse abdominal tenderness to palpation.  No chest wall tenderness to palpation.  No acute cardiovascular, respiratory, abdominal exam findings.  Differential diagnosis includes viral etiology, C. difficile, ACS, PTX, pneumonia, costochondritis.   Co morbidities that complicate the patient evaluation: Anemia  Additional history obtained:  External records from outside source obtained and reviewed including: Patient had a spontaneous vaginal delivery on 06/13/2022.  Patient was also diagnosed with acute cystitis on 06/19/2022.  Was diagnosed with postpartum endometritis on 06/17/2022.  Labs:  I ordered, and personally interpreted labs.  The pertinent results include:   CBC unremarkable Initial troponin <2 CMP with slightly elevated creatinine at 1.01 otherwise unremarkable Lipase at 49 Pregnancy urine negative Urinalysis with trace of hgb otherwise negative C. difficile and O&P ordered with results pending at time of sign out.   Imaging: I ordered imaging studies including Chest x-ray and CT AP ordered with results pending at time of sign out I independently visualized and interpreted imaging which showed: CXR without acute findings.  I agree with the radiologist interpretation  Medications:  I ordered medication including Tylenol, Zofran, IV fluids for symptom management I have reviewed the patients home medicines and have made adjustments as needed   Patient case discussed with Agustin Cree, PA-C at sign-out. Plan at sign-out is pending CT AP. Disposition as per oncoming  team, however, plans may change as per oncoming team. Patient care transferred at sign out.   This chart was dictated using voice recognition software, Dragon. Despite the best efforts of this provider to proofread and correct errors, errors may still occur which can change documentation meaning.   Final Clinical Impression(s) / ED Diagnoses Final diagnoses:  Diarrhea, unspecified type  Chest wall pain    Rx / DC Orders ED Discharge Orders     None         Novelle Addair A, PA-C 07/13/22 1842    Charlynne Pander, MD 07/14/22 [phone]

## 2022-07-13 NOTE — ED Provider Notes (Signed)
 Patient is a handoff from [place], [place].  Plan at time of handoff is to await results of CT abdomen pelvis for disposition. Physical Exam  BP 110/83   Pulse 91   Temp 98.7 F (37.1 C) (Oral)   Resp 18   Ht 5\' 4"  (1.626 m)   Wt 72 kg   LMP 07/08/2022   SpO2 96%   Breastfeeding Yes   BMI 27.25 kg/m   Physical Exam  Procedures  Procedures  ED Course / MDM    Medical Decision Making Amount and/or Complexity of Data Reviewed Labs: ordered. Radiology: ordered.  Risk OTC drugs. Prescription drug management.   Patient was a handoff from [place], [place]. Please see their note for full HPI and physical exam findings. Plan at time of handoff was to await CT results for dispo. Patient is declining CT at this time. Instead will discharge patient home with pending C diff results.  11:47 PM C diff results positive. Prescription for Vancomycin 125mg  QID sent to patient's pharmacy. Patient informed of results. Patient agreeable with treatment plan and understands all return precautions.       Smitty Knudsen, PA-C 07/13/22 2349    Charlynne Pander, MD 07/14/22 [phone]

## 2022-07-13 NOTE — Discharge Instructions (Addendum)
You are seen in the emergency department for diarrhea.  Your lab workup was largely reassuring.  You were C. difficile test is still pending.  If this test were to be positive, you will be called in an antibiotic prescription to have this treated.  If you have any worsening of your symptoms please return to the emergency department.

## 2022-07-14 ENCOUNTER — Observation Stay (HOSPITAL_BASED_OUTPATIENT_CLINIC_OR_DEPARTMENT_OTHER)
Admission: EM | Admit: 2022-07-14 | Discharge: 2022-07-15 | Disposition: A | Discharge status: Home / Self Care | Payer: Managed Care, Other (non HMO) | Attending: Family Medicine | Admitting: Family Medicine

## 2022-07-14 ENCOUNTER — Emergency Department (HOSPITAL_BASED_OUTPATIENT_CLINIC_OR_DEPARTMENT_OTHER): Payer: Managed Care, Other (non HMO)

## 2022-07-14 ENCOUNTER — Other Ambulatory Visit: Payer: Self-pay

## 2022-07-14 ENCOUNTER — Encounter (HOSPITAL_BASED_OUTPATIENT_CLINIC_OR_DEPARTMENT_OTHER): Payer: Self-pay

## 2022-07-14 ENCOUNTER — Telehealth (HOSPITAL_BASED_OUTPATIENT_CLINIC_OR_DEPARTMENT_OTHER): Payer: Self-pay | Admitting: Emergency Medicine

## 2022-07-14 DIAGNOSIS — F17210 Nicotine dependence, cigarettes, uncomplicated: Secondary | ICD-10-CM | POA: Diagnosis not present

## 2022-07-14 DIAGNOSIS — Z79899 Other long term (current) drug therapy: Secondary | ICD-10-CM | POA: Diagnosis not present

## 2022-07-14 DIAGNOSIS — A0472 Enterocolitis due to Clostridium difficile, not specified as recurrent: Principal | ICD-10-CM | POA: Diagnosis present

## 2022-07-14 DIAGNOSIS — R109 Unspecified abdominal pain: Secondary | ICD-10-CM | POA: Diagnosis present

## 2022-07-14 LAB — CBC WITH DIFFERENTIAL/PLATELET
Abs Immature Granulocytes: 0.02 10*3/uL (ref 0.00–0.07)
Basophils Absolute: 0 10*3/uL (ref 0.0–0.1)
Basophils Relative: 0 %
Eosinophils Absolute: 0 10*3/uL (ref 0.0–0.5)
Eosinophils Relative: 1 %
HCT: 34.6 % — ABNORMAL LOW (ref 36.0–46.0)
Hemoglobin: 11.5 g/dL — ABNORMAL LOW (ref 12.0–15.0)
Immature Granulocytes: 0 %
Lymphocytes Relative: 18 %
Lymphs Abs: 1.4 10*3/uL (ref 0.7–4.0)
MCH: 30.2 pg (ref 26.0–34.0)
MCHC: 33.2 g/dL (ref 30.0–36.0)
MCV: 90.8 fL (ref 80.0–100.0)
Monocytes Absolute: 0.6 10*3/uL (ref 0.1–1.0)
Monocytes Relative: 7 %
Neutro Abs: 5.7 10*3/uL (ref 1.7–7.7)
Neutrophils Relative %: 74 %
Platelets: 206 10*3/uL (ref 150–400)
RBC: 3.81 MIL/uL — ABNORMAL LOW (ref 3.87–5.11)
RDW: 13.1 % (ref 11.5–15.5)
WBC: 7.7 10*3/uL (ref 4.0–10.5)
nRBC: 0 % (ref 0.0–0.2)

## 2022-07-14 LAB — COMPREHENSIVE METABOLIC PANEL
ALT: 25 U/L (ref 0–44)
AST: 24 U/L (ref 15–41)
Albumin: 3.6 g/dL (ref 3.5–5.0)
Alkaline Phosphatase: 50 U/L (ref 38–126)
Anion gap: 7 (ref 5–15)
BUN: 8 mg/dL (ref 6–20)
CO2: 24 mmol/L (ref 22–32)
Calcium: 8.3 mg/dL — ABNORMAL LOW (ref 8.9–10.3)
Chloride: 104 mmol/L (ref 98–111)
Creatinine, Ser: 0.8 mg/dL (ref 0.44–1.00)
GFR, Estimated: >60 mL/min (ref >60)
Glucose, Bld: 110 mg/dL — ABNORMAL HIGH (ref 70–99)
Potassium: 3.6 mmol/L (ref 3.5–5.1)
Sodium: 135 mmol/L (ref 135–145)
Total Bilirubin: 0.8 mg/dL (ref 0.3–1.2)
Total Protein: 6.7 g/dL (ref 6.5–8.1)

## 2022-07-14 LAB — LIPASE, BLOOD: Lipase: 37 U/L (ref 11–51)

## 2022-07-14 MED ORDER — IOHEXOL 300 MG/ML  SOLN
80.0000 mL | Freq: Once | INTRAMUSCULAR | Status: AC | PRN
  Administered 2022-07-14: 80 mL via INTRAVENOUS

## 2022-07-14 MED ORDER — ONDANSETRON HCL 4 MG/2ML IJ SOLN
4.0000 mg | Freq: Once | INTRAMUSCULAR | Status: AC
  Administered 2022-07-14: 4 mg via INTRAVENOUS
  Filled 2022-07-14: qty 2

## 2022-07-14 MED ORDER — KETOROLAC TROMETHAMINE 30 MG/ML IJ SOLN
30.0000 mg | Freq: Once | INTRAMUSCULAR | Status: DC
  Filled 2022-07-14: qty 1

## 2022-07-14 MED ORDER — ONDANSETRON HCL 4 MG PO TABS: 4.0000 mg | ORAL_TABLET | Freq: Four times a day (QID) | ORAL | Status: DC | PRN

## 2022-07-14 MED ORDER — OXYCODONE-ACETAMINOPHEN 5-325 MG PO TABS
1.0000 | ORAL_TABLET | Freq: Once | ORAL | Status: AC
  Administered 2022-07-14: 1 via ORAL
  Filled 2022-07-14: qty 1

## 2022-07-14 MED ORDER — ENOXAPARIN SODIUM 40 MG/0.4ML IJ SOSY: 40.0000 mg | PREFILLED_SYRINGE | Freq: Every day | INTRAMUSCULAR | Status: DC

## 2022-07-14 MED ORDER — DIAZEPAM 5 MG/ML IJ SOLN
5.0000 mg | Freq: Once | INTRAMUSCULAR | Status: AC
  Administered 2022-07-14: 5 mg via INTRAVENOUS
  Filled 2022-07-14: qty 2

## 2022-07-14 MED ORDER — VANCOMYCIN HCL 125 MG PO CAPS
125.0000 mg | ORAL_CAPSULE | Freq: Four times a day (QID) | ORAL | Status: DC
  Administered 2022-07-14 – 2022-07-15 (×3): 125 mg via ORAL
  Filled 2022-07-14 (×5): qty 1

## 2022-07-14 MED ORDER — MORPHINE SULFATE (PF) 4 MG/ML IV SOLN
4.0000 mg | Freq: Once | INTRAVENOUS | Status: DC
  Filled 2022-07-14: qty 1

## 2022-07-14 MED ORDER — METOCLOPRAMIDE HCL 5 MG/ML IJ SOLN
5.0000 mg | Freq: Once | INTRAMUSCULAR | Status: AC
  Administered 2022-07-14: 5 mg via INTRAVENOUS
  Filled 2022-07-14: qty 2

## 2022-07-14 MED ORDER — ACETAMINOPHEN 650 MG RE SUPP: 650.0000 mg | Freq: Four times a day (QID) | RECTAL | Status: DC | PRN

## 2022-07-14 MED ORDER — POTASSIUM CHLORIDE IN NACL 20-0.9 MEQ/L-% IV SOLN
INTRAVENOUS | Status: DC
  Filled 2022-07-14: qty 1000

## 2022-07-14 MED ORDER — VANCOMYCIN HCL 125 MG PO CAPS: 125.0000 mg | ORAL_CAPSULE | Freq: Four times a day (QID) | ORAL | 0 refills | Status: AC

## 2022-07-14 MED ORDER — ONDANSETRON HCL 4 MG/2ML IJ SOLN
4.0000 mg | Freq: Four times a day (QID) | INTRAMUSCULAR | Status: DC | PRN
  Administered 2022-07-15: 4 mg via INTRAVENOUS
  Filled 2022-07-14: qty 2

## 2022-07-14 MED ORDER — LACTATED RINGERS IV BOLUS
2000.0000 mL | Freq: Once | INTRAVENOUS | Status: AC
  Administered 2022-07-14: 2000 mL via INTRAVENOUS

## 2022-07-14 MED ORDER — HYDROMORPHONE HCL 1 MG/ML IJ SOLN
0.5000 mg | Freq: Once | INTRAMUSCULAR | Status: AC
  Administered 2022-07-14: 0.5 mg via INTRAVENOUS
  Filled 2022-07-14: qty 1

## 2022-07-14 MED ORDER — ACETAMINOPHEN 325 MG PO TABS: 650.0000 mg | ORAL_TABLET | Freq: Four times a day (QID) | ORAL | Status: DC | PRN

## 2022-07-14 NOTE — Telephone Encounter (Signed)
Patient's vancomycin for C. difficile sent to wrong pharmacy.  Requesting to be sent to [facility] on [address] which I have sent to.

## 2022-07-14 NOTE — ED Provider Notes (Signed)
[place] [facility] Provider Note   CSN: [id number] Arrival date & time: 07/14/22  1658     History  Chief Complaint  Patient presents with   Abdominal Pain    Aimee Escobar is a 42 y.o. female.  With a history of anxiety, anemia, spontaneous vaginal delivery on 06/11/2022, postpartum endometritis on 06/17/2022 who presents to the ED for evaluation of abdominal pain.  She was seen in this emergency department yesterday and diagnosed with C. difficile.  This was found to be secondary to her recent broad-spectrum antibiotic use from endometritis and cystitis.  She was started on p.o. vancomycin and states that she has taken all 3 of her doses today.  She states she has had more episodes of diarrhea today than she can count.  She feels like she is "unable to continue living like this and I feel like I am going to die."  She endorses generalized abdominal pain and describes it as a sharp and stabbing sensation.  She has noted a small amount of blood in her stool as well today.  She reports nausea but denies any vomiting.  No fevers or chills.  No urinary symptoms.  She did decline CT imaging yesterday stating that she did not feel like it was necessary at that time.   Abdominal Pain Associated symptoms: diarrhea and nausea        Home Medications Prior to Admission medications   Medication Sig Start Date End Date Taking? Authorizing Provider  Ascorbic Acid (VITAMIN C PO) Take by mouth. Patient not taking: Reported on 11/20/2020    [provider]  ciprofloxacin (CIPRO) 500 MG tablet Take 1 tablet (500 mg total) by mouth 2 (two) times daily. One po bid x 7 days 07/06/22   Geoffery Lyons, MD  clonazePAM (KLONOPIN) 0.5 MG tablet Take 1 tablet by mouth as needed. Patient not taking: Reported on 11/20/2020 08/21/15   [provider]  Cyanocobalamin (VITAMIN B-12 PO) Take by mouth as needed. Patient not taking: Reported on 11/20/2020     [provider]  Ferrous Sulfate (IRON PO) Take by mouth as needed. Patient not taking: Reported on 11/20/2020    [provider]  MILK THISTLE PO Take 1 tablet by mouth daily. Patient not taking: Reported on 11/20/2020    [provider]  Multiple Vitamins-Minerals (ZINC PO) Take by mouth daily. 11 pumps daily Patient not taking: Reported on 11/20/2020    [provider]  naproxen (NAPROSYN) 250 MG tablet Take 2 tablets (500 mg total) by mouth 2 (two) times daily with a meal. Patient not taking: Reported on 11/20/2020 08/11/19   Caccavale, Sophia, PA-C  norethindrone-ethinyl estradiol (LOESTRIN) 1-20 MG-MCG tablet Take 1 tablet by mouth daily. 11/20/20   Willodean Rosenthal, MD  pantoprazole (PROTONIX) 40 MG tablet Take 1 tablet (40 mg total) by mouth daily. 08/19/19 09/18/19  Lynann Bologna, MD  vancomycin (VANCOCIN) 125 MG capsule Take 1 capsule (125 mg total) by mouth 4 (four) times daily for 10 days. 07/14/22 07/24/22  Mardene Sayer, MD  Vitamin D-Vitamin K (VITAMIN K2-VITAMIN D3 PO) Take by mouth. Patient not taking: Reported on 11/20/2020    [provider]      Allergies    Gadolinium derivatives, Other, and Tomato    Review of Systems   Review of Systems  Gastrointestinal:  Positive for abdominal pain, diarrhea and nausea.  All other systems reviewed and are negative.   Physical Exam Updated Vital  Signs BP 119/61 (BP Location: Right Arm)   Pulse 77   Temp 97.7 F (36.5 C) (Oral)   Resp 16   Ht 5\' 4"  (1.626 m)   Wt 72 kg   LMP 07/08/2022   SpO2 100%   Breastfeeding Yes   BMI 27.25 kg/m  Physical Exam Vitals and nursing note reviewed.  Constitutional:      General: She is not in acute distress.    Appearance: She is well-developed.     Comments: Resting comfortably in bed  HENT:     Head: Normocephalic and atraumatic.  Eyes:     Conjunctiva/sclera: Conjunctivae normal.  Cardiovascular:     Rate and Rhythm:  Normal rate and regular rhythm.     Heart sounds: No murmur heard. Pulmonary:     Effort: Pulmonary effort is normal. No respiratory distress.     Breath sounds: Normal breath sounds.  Abdominal:     General: Abdomen is flat. Bowel sounds are increased.     Palpations: Abdomen is soft.     Tenderness: There is generalized abdominal tenderness. There is no right CVA tenderness, left CVA tenderness or guarding.  Musculoskeletal:        General: No swelling.     Cervical back: Neck supple.  Skin:    General: Skin is warm and dry.     Capillary Refill: Capillary refill takes less than 2 seconds.  Neurological:     Mental Status: She is alert.  Psychiatric:        Mood and Affect: Mood normal.     ED Results / Procedures / Treatments   Labs (all labs ordered are listed, but only abnormal results are displayed) Labs Reviewed  CBC WITH DIFFERENTIAL/PLATELET - Abnormal; Notable for the following components:      Result Value   RBC 3.81 (*)    Hemoglobin 11.5 (*)    HCT 34.6 (*)    All other components within normal limits  COMPREHENSIVE METABOLIC PANEL - Abnormal; Notable for the following components:   Glucose, Bld 110 (*)    Calcium 8.3 (*)    All other components within normal limits  LIPASE, BLOOD    EKG None  Radiology CT ABDOMEN PELVIS W CONTRAST  Result Date: 07/14/2022 CLINICAL DATA:  Abdominal pain, C difficile infection EXAM: CT ABDOMEN AND PELVIS WITH CONTRAST TECHNIQUE: Multidetector CT imaging of the abdomen and pelvis was performed using the standard protocol following bolus administration of intravenous contrast. RADIATION DOSE REDUCTION: This exam was performed according to the departmental dose-optimization program which includes automated exposure control, adjustment of the mA and/or kV according to patient size and/or use of iterative reconstruction technique. CONTRAST:  80mL OMNIPAQUE IOHEXOL 300 MG/ML  SOLN COMPARISON:  CT abdomen pelvis dated 07/15/2019.  FINDINGS: Lower chest: No acute abnormality. Hepatobiliary: A 6 mm hypoattenuating lesion in the right hepatic lobe is incompletely characterized. No gallstones, gallbladder wall thickening, or biliary dilatation. Pancreas: Unremarkable. No pancreatic ductal dilatation or surrounding inflammatory changes. Spleen: Normal in size without focal abnormality. Adrenals/Urinary Tract: Adrenal glands are unremarkable. Bilateral nonobstructive renal calculi measure up to 3 mm on the right and 2 mm on the left. No focal renal lesion or hydronephrosis on either side. Bladder is unremarkable. Stomach/Bowel: Stomach is within normal limits. There is mild bowel wall thickening and mucosal hyperenhancement involving the rectum. There is circumferential bowel wall thickening and mucosal hyperenhancement involving the ascending and transverse colon. No significant surrounding inflammatory changes. Appendix appears normal. No evidence of  bowel obstruction. Vascular/Lymphatic: No significant vascular findings are present. No enlarged abdominal or pelvic lymph nodes. Reproductive: Uterus and bilateral adnexa are unremarkable. Other: No abdominal wall hernia or abnormality. No abdominopelvic ascites. Musculoskeletal: No acute or significant osseous findings. IMPRESSION: 1. Bowel wall thickening and mucosal hyperenhancement involving the ascending colon, transverse colon, and rectum is consistent with C difficile colitis. Electronically Signed   By: Romona Curls M.D.   On: 07/14/2022 19:25   DG Chest Portable 1 View  Result Date: 07/13/2022 CLINICAL DATA:  Chest wall pain for 1 week EXAM: PORTABLE CHEST 1 VIEW COMPARISON:  Chest x-ray 03/01/2020 FINDINGS: The heart size and mediastinal contours are within normal limits. Both lungs are clear. The visualized skeletal structures are unremarkable. IMPRESSION: No active disease. Electronically Signed   By: Darliss Cheney M.D.   On: 07/13/2022 18:17    Procedures Procedures     Medications Ordered in ED Medications  vancomycin (VANCOCIN) capsule 125 mg (has no administration in time range)  lactated ringers bolus 2,000 mL (0 mLs Intravenous Stopped 07/14/22 1949)  ondansetron (ZOFRAN) injection 4 mg (4 mg Intravenous Given 07/14/22 1754)  HYDROmorphone (DILAUDID) injection 0.5 mg (0.5 mg Intravenous Given 07/14/22 1827)  iohexol (OMNIPAQUE) 300 MG/ML solution 80 mL (80 mLs Intravenous Contrast Given 07/14/22 1849)  diazepam (VALIUM) injection 5 mg (5 mg Intravenous Given 07/14/22 1921)    ED Course/ Medical Decision Making/ A&P Clinical Course as of 07/14/22 2008  Sun Jul 14, 2022  2003 Spoke with hospitalist Dr. Margo Aye who will admit [AS]    Clinical Course User Index [AS] Lula Olszewski Edsel Petrin, PA-C                             Medical Decision Making Amount and/or Complexity of Data Reviewed Labs: ordered. Radiology: ordered.  Risk Prescription drug management. Decision regarding hospitalization.  This patient presents to the ED for concern of abdominal pain, this involves an extensive number of treatment options, and is a complaint that carries with it a high risk of complications and morbidity.  The differential diagnosis for generalized abdominal pain includes, but is not limited to AAA, gastroenteritis, appendicitis, Bowel obstruction, Bowel perforation. Gastroparesis, DKA, Hernia, Inflammatory bowel disease, mesenteric ischemia, pancreatitis, peritonitis SBP, volvulus.   Co morbidities that complicate the patient evaluation   anxiety, anemia, spontaneous vaginal delivery on 06/11/2022, postpartum endometritis on 06/17/2022  My initial workup includes labs, imaging, symptom control  Additional history obtained from: Nursing notes from this visit. Previous records within EMR system ED visit for same yesterday  I ordered, reviewed and interpreted labs which include: CBC, CMP, lipase.  Hemoglobin of 11.5 which is decreased from yesterday but similar  to 8 days ago.  Hyperglycemia 110.  I ordered imaging studies including CT abdomen pelvis I independently visualized and interpreted imaging which showed C. difficile colitis I agree with the radiologist interpretation  Consultations Obtained:  I requested consultation with the hospitalist Dr. Margo Aye,  and discussed lab and imaging findings as well as pertinent plan - they recommend: Admission  Afebrile, hemodynamically stable.  42 year old female presenting to the ED for evaluation of severe abdominal pain.  She was diagnosed with C. difficile yesterday.  She declined CT imaging at that time.  She has been taking her vancomycin as prescribed but states that the pain has become too severe.  She has generalized abdominal TTP.  Her abdomen is still soft.  CT shows C. difficile colitis.  She was treated with nausea medicine, pain medicine, IV fluids and reported some improvement in her symptoms.  States she still feels unsafe to go home.  Will admit to hospitalist service for continued management.  Patient is in agreement with this plan.  Stable at the time of admission.  Patient's case discussed with Dr. Lockie Mola who agrees with plan to admit.   Note: Portions of this report may have been transcribed using voice recognition software. Every effort was made to ensure accuracy; however, inadvertent computerized transcription errors may still be present.        Final Clinical Impression(s) / ED Diagnoses Final diagnoses:  Clostridium difficile colitis    Rx / DC Orders ED Discharge Orders     None         Michelle Piper, PA-C 07/14/22 2008    Virgina Norfolk, DO 07/14/22 2144

## 2022-07-14 NOTE — ED Notes (Signed)
Pt states "nothing has touched my headache."

## 2022-07-14 NOTE — ED Notes (Signed)
Pt refused toradol, states "he told me I could have the oral medication that they gave me yesterday."  RN informed provider who prescribed medication.

## 2022-07-14 NOTE — ED Triage Notes (Signed)
Patient was diagnosed with C diff yesterday. She is having worse pain and N/V/D. Unknown if she has a fever.

## 2022-07-14 NOTE — ED Notes (Signed)
[facility] called for transport. 

## 2022-07-14 NOTE — ED Notes (Signed)
Pt gave verbal consent for transfer to [facility].  Two RNs signed consent.  RN called floor at [facility] and made them aware that pt was in route.

## 2022-07-15 ENCOUNTER — Telehealth (INDEPENDENT_AMBULATORY_CARE_PROVIDER_SITE_OTHER): Payer: Self-pay

## 2022-07-15 DIAGNOSIS — A0472 Enterocolitis due to Clostridium difficile, not specified as recurrent: Secondary | ICD-10-CM | POA: Diagnosis not present

## 2022-07-15 LAB — CBC WITH DIFFERENTIAL/PLATELET
Abs Immature Granulocytes: 0.02 10*3/uL (ref 0.00–0.07)
Basophils Absolute: 0 10*3/uL (ref 0.0–0.1)
Basophils Relative: 1 %
Eosinophils Absolute: 0.1 10*3/uL (ref 0.0–0.5)
Eosinophils Relative: 3 %
HCT: 31.5 % — ABNORMAL LOW (ref 36.0–46.0)
Hemoglobin: 10.2 g/dL — ABNORMAL LOW (ref 12.0–15.0)
Immature Granulocytes: 0 %
Lymphocytes Relative: 36 %
Lymphs Abs: 1.7 10*3/uL (ref 0.7–4.0)
MCH: 29.9 pg (ref 26.0–34.0)
MCHC: 32.4 g/dL (ref 30.0–36.0)
MCV: 92.4 fL (ref 80.0–100.0)
Monocytes Absolute: 0.5 10*3/uL (ref 0.1–1.0)
Monocytes Relative: 10 %
Neutro Abs: 2.4 10*3/uL (ref 1.7–7.7)
Neutrophils Relative %: 50 %
Platelets: 178 10*3/uL (ref 150–400)
RBC: 3.41 MIL/uL — ABNORMAL LOW (ref 3.87–5.11)
RDW: 12.8 % (ref 11.5–15.5)
WBC: 4.8 10*3/uL (ref 4.0–10.5)
nRBC: 0 % (ref 0.0–0.2)

## 2022-07-15 LAB — CREATININE, SERUM
Creatinine, Ser: 0.91 mg/dL (ref 0.44–1.00)
GFR, Estimated: >60 mL/min (ref >60)

## 2022-07-15 LAB — CBC
HCT: 32.8 % — ABNORMAL LOW (ref 36.0–46.0)
Hemoglobin: 10.7 g/dL — ABNORMAL LOW (ref 12.0–15.0)
MCH: 29.8 pg (ref 26.0–34.0)
MCHC: 32.6 g/dL (ref 30.0–36.0)
MCV: 91.4 fL (ref 80.0–100.0)
Platelets: 197 10*3/uL (ref 150–400)
RBC: 3.59 MIL/uL — ABNORMAL LOW (ref 3.87–5.11)
RDW: 13 % (ref 11.5–15.5)
WBC: 5.5 10*3/uL (ref 4.0–10.5)
nRBC: 0 % (ref 0.0–0.2)

## 2022-07-15 LAB — BASIC METABOLIC PANEL
Anion gap: 11 (ref 5–15)
BUN: 7 mg/dL (ref 6–20)
CO2: 24 mmol/L (ref 22–32)
Calcium: 8.4 mg/dL — ABNORMAL LOW (ref 8.9–10.3)
Chloride: 105 mmol/L (ref 98–111)
Creatinine, Ser: 0.9 mg/dL (ref 0.44–1.00)
GFR, Estimated: >60 mL/min (ref >60)
Glucose, Bld: 93 mg/dL (ref 70–99)
Potassium: 3.6 mmol/L (ref 3.5–5.1)
Sodium: 140 mmol/L (ref 135–145)

## 2022-07-15 LAB — MAGNESIUM: Magnesium: 1.7 mg/dL (ref 1.7–2.4)

## 2022-07-15 LAB — HIV ANTIBODY (ROUTINE TESTING W REFLEX): HIV Screen 4th Generation wRfx: NONREACTIVE

## 2022-07-15 MED ORDER — OXYCODONE-ACETAMINOPHEN 5-325 MG PO TABS: 1.0000 | ORAL_TABLET | Freq: Four times a day (QID) | ORAL | Status: DC | PRN

## 2022-07-15 MED ORDER — ONDANSETRON 4 MG PO TBDP: 4.0000 mg | ORAL_TABLET | Freq: Three times a day (TID) | ORAL | 0 refills | Status: AC | PRN

## 2022-07-15 MED ORDER — OXYCODONE HCL 5 MG PO CAPS: 5.0000 mg | ORAL_CAPSULE | ORAL | 0 refills | Status: AC | PRN

## 2022-07-15 NOTE — Telephone Encounter (Signed)
Copied from CRM [id number]. Topic: Appointment Scheduling - Scheduling Inquiry for Clinic >> Jul 15, 2022  4:44 PM Epimenio Foot F wrote: Reason for CRM: Pt is getting dishcarged from [facility] 07/15/22 and needs a hospital follow up within 2 weeks. Earliest available is July 8th , and that's too far out for pt and wants to know can she be seen sooner. Please advise. [phone]

## 2022-07-15 NOTE — H&P (Signed)
PCP:   System, Provider Not In   Chief Complaint:  Diarrhea  HPI: This is a 42 year old female with no significant past medical history except for childbirth approximately a month ago.  In the postpartum.  Patient had broad-spectrum antibiotic for in the retry test and they cystitis.  Approximately 4 days ago she developed diarrhea.  Patient was in the ER 6/15, diagnosed with C. difficile diarrhea.  She declined admission and CT abdomen.  She was discharged on p.o. vancomycin.  Per patient the next day she had severe diarrhea from sun up to sundown.  She had cramps and nausea.  She returned to the [facility].  She denies fevers or chills or vomiting.  CT abdomen pelvis reveals finding consistent with C. difficile diarrhea.  Review of Systems:  Per HPI.  Past Medical History: Past Medical History:  Diagnosis Date   Anemia    Anxiety    Epstein Barr virus infection    Mononucleosis    Past Surgical History:  Procedure Laterality Date   BREAST BIOPSY Right     Medications: Prior to Admission medications   Medication Sig Start Date End Date Taking? Authorizing Provider  Ascorbic Acid (VITAMIN C PO) Take by mouth. Patient not taking: Reported on 11/20/2020    [provider]  ciprofloxacin (CIPRO) 500 MG tablet Take 1 tablet (500 mg total) by mouth 2 (two) times daily. One po bid x 7 days 07/06/22   Geoffery Lyons, MD  clonazePAM (KLONOPIN) 0.5 MG tablet Take 1 tablet by mouth as needed. Patient not taking: Reported on 11/20/2020 08/21/15   [provider]  Cyanocobalamin (VITAMIN B-12 PO) Take by mouth as needed. Patient not taking: Reported on 11/20/2020    [provider]  Ferrous Sulfate (IRON PO) Take by mouth as needed. Patient not taking: Reported on 11/20/2020    [provider]  MILK THISTLE PO Take 1 tablet by mouth daily. Patient not taking: Reported on 11/20/2020    [provider]  Multiple Vitamins-Minerals (ZINC PO) Take  by mouth daily. 11 pumps daily Patient not taking: Reported on 11/20/2020    [provider]  naproxen (NAPROSYN) 250 MG tablet Take 2 tablets (500 mg total) by mouth 2 (two) times daily with a meal. Patient not taking: Reported on 11/20/2020 08/11/19   Caccavale, Sophia, PA-C  norethindrone-ethinyl estradiol (LOESTRIN) 1-20 MG-MCG tablet Take 1 tablet by mouth daily. 11/20/20   Willodean Rosenthal, MD  pantoprazole (PROTONIX) 40 MG tablet Take 1 tablet (40 mg total) by mouth daily. 08/19/19 09/18/19  Lynann Bologna, MD  vancomycin (VANCOCIN) 125 MG capsule Take 1 capsule (125 mg total) by mouth 4 (four) times daily for 10 days. 07/14/22 07/24/22  Mardene Sayer, MD  Vitamin D-Vitamin K (VITAMIN K2-VITAMIN D3 PO) Take by mouth. Patient not taking: Reported on 11/20/2020    [provider]    Allergies:   Allergies  Allergen Reactions   Gadolinium Derivatives Cough and Nausea Only    Other reaction(s): Diarrhea-Intolerance Itching in throat and coughing    Other     Other reaction(s): Diarrhea-Intolerance Itching in throat and coughing  gadolinium contrast   Tomato     Can do Raw but not cooked Said she will itch all over and get gas    Social History:  reports that she has been smoking cigarettes. She has been smoking an average of .5 packs per day. She has never used smokeless tobacco. She reports current alcohol use of about 4.0  standard drinks of alcohol per week. She reports that she does not currently use drugs.  Family History: Family History  Problem Relation Age of Onset   Pancreatic cancer Father    Diabetes Paternal Aunt    Colon cancer Neg Hx    Esophageal cancer Neg Hx    Cancer Neg Hx    Hypertension Neg Hx     Physical Exam: Vitals:   07/14/22 1705 07/14/22 1920 07/14/22 2000 07/14/22 2206  BP: (!) 135/92 119/61 101/60 98/77  Pulse: 81 77 (!) 59 63  Resp: 18 16  18   Temp: 97.7 F (36.5 C) 97.7 F (36.5 C)  98 F (36.7 C)  TempSrc:  Oral Oral  Oral  SpO2: 99% 100% 98% 100%  Weight:      Height:        General:  Alert and oriented times three, well developed and nourished, no acute distress Eyes: PERRLA, pink conjunctiva, no scleral icterus ENT: Moist oral mucosa, neck supple, no thyromegaly Lungs: clear to ascultation, no wheeze, no crackles, no use of accessory muscles Cardiovascular: regular rate and rhythm, no regurgitation, no gallops, no murmurs. No carotid bruits, no JVD Abdomen: soft, positive BS, non-tender, non-distended, no organomegaly, not an acute abdomen GU: not examined Neuro: CN II - XII grossly intact, sensation intact Musculoskeletal: strength 5/5 all extremities, no clubbing, cyanosis or edema Skin: no rash, no subcutaneous crepitation, no decubitus Psych: appropriate patient   Labs on Admission:  Recent Labs    07/13/22 1721 07/14/22 1756  NA 139 135  K 3.6 3.6  CL 101 104  CO2 26 24  GLUCOSE 102* 110*  BUN 12 8  CREATININE 1.01* 0.80  CALCIUM 8.9 8.3*   Recent Labs    07/13/22 1721 07/14/22 1756  AST 29 24  ALT 30 25  ALKPHOS 57 50  BILITOT 0.7 0.8  PROT 7.5 6.7  ALBUMIN 4.0 3.6   Recent Labs    07/13/22 1721 07/14/22 1756  LIPASE 49 37   Recent Labs    07/13/22 1721 07/14/22 1756  WBC 7.4 7.7  NEUTROABS  --  5.7  HGB 13.4 11.5*  HCT 41.0 34.6*  MCV 92.1 90.8  PLT 258 206     Micro Results: Recent Results (from the past 240 hour(s))  Urine Culture     Status: None   Collection Time: 07/06/22  3:03 AM   Specimen: Urine, Clean Catch  Result Value Ref Range Status   Specimen Description   Final    URINE, CLEAN CATCH Performed at [facility], [address] [place], [place] [address]    Special Requests   Final    NONE Performed at [facility], [address] [place], [place] [address]    Culture   Final    NO GROWTH Performed at [facility], [address], [place], [place] [address]    Report Status 07/07/2022  FINAL  Final  C Difficile Quick Screen w PCR reflex     Status: Abnormal   Collection Time: 07/13/22  5:55 PM   Specimen: STOOL  Result Value Ref Range Status   C Diff antigen POSITIVE (A) NEGATIVE Final   C Diff toxin POSITIVE (A) NEGATIVE Final   C Diff interpretation Toxin producing C. difficile detected.  Final    Comment: CRITICAL RESULT CALLED TO, READ BACK BY AND VERIFIED WITH: POWELL RN 07/13/22 @ 2315 BY AB Performed at [facility], [address] [place], [place] [address]      Radiological Exams on Admission: CT ABDOMEN PELVIS W CONTRAST  Result Date: 07/14/2022 CLINICAL DATA:  Abdominal pain, C difficile infection EXAM: CT ABDOMEN AND PELVIS WITH CONTRAST TECHNIQUE: Multidetector CT imaging of the abdomen and pelvis was performed using the standard protocol following bolus administration of intravenous contrast. RADIATION DOSE REDUCTION: This exam was performed according to the departmental dose-optimization program which includes automated exposure control, adjustment of the mA and/or kV according to patient size and/or use of iterative reconstruction technique. CONTRAST:  80mL OMNIPAQUE IOHEXOL 300 MG/ML  SOLN COMPARISON:  CT abdomen pelvis dated 07/15/2019. FINDINGS: Lower chest: No acute abnormality. Hepatobiliary: A 6 mm hypoattenuating lesion in the right hepatic lobe is incompletely characterized. No gallstones, gallbladder wall thickening, or biliary dilatation. Pancreas: Unremarkable. No pancreatic ductal dilatation or surrounding inflammatory changes. Spleen: Normal in size without focal abnormality. Adrenals/Urinary Tract: Adrenal glands are unremarkable. Bilateral nonobstructive renal calculi measure up to 3 mm on the right and 2 mm on the left. No focal renal lesion or hydronephrosis on either side. Bladder is unremarkable. Stomach/Bowel: Stomach is within normal limits. There is mild bowel wall thickening and mucosal hyperenhancement involving the rectum. There is  circumferential bowel wall thickening and mucosal hyperenhancement involving the ascending and transverse colon. No significant surrounding inflammatory changes. Appendix appears normal. No evidence of bowel obstruction. Vascular/Lymphatic: No significant vascular findings are present. No enlarged abdominal or pelvic lymph nodes. Reproductive: Uterus and bilateral adnexa are unremarkable. Other: No abdominal wall hernia or abnormality. No abdominopelvic ascites. Musculoskeletal: No acute or significant osseous findings. IMPRESSION: 1. Bowel wall thickening and mucosal hyperenhancement involving the ascending colon, transverse colon, and rectum is consistent with C difficile colitis. Electronically Signed   By: Romona Curls M.D.   On: 07/14/2022 19:25   DG Chest Portable 1 View  Result Date: 07/13/2022 CLINICAL DATA:  Chest wall pain for 1 week EXAM: PORTABLE CHEST 1 VIEW COMPARISON:  Chest x-ray 03/01/2020 FINDINGS: The heart size and mediastinal contours are within normal limits. Both lungs are clear. The visualized skeletal structures are unremarkable. IMPRESSION: No active disease. Electronically Signed   By: Darliss Cheney M.D.   On: 07/13/2022 18:17    Assessment/Plan Present on Admission:  Clostridium difficile colitis -Continue p.o. vancomycin 3 times daily -IV fluid hydration -Isolation ordered -Patient educated on handwashing, transmissibility and cleaning up to protect infants. -Zofran as needed nausea -Percocet abdominal cramping discomfort  Postpartum 1 month -Patient pumping and dumping  Prathik Aman 07/15/2022, 1:20 AM

## 2022-07-15 NOTE — Discharge Summary (Signed)
Physician Discharge Summary  Aimee HAYDON Escobar:[id number] DOB: Mar 21, 1980 DOA: 07/14/2022  PCP: System, Provider Not In  Admit date: 07/14/2022 Discharge date: 07/15/2022 30 Day Unplanned Readmission Risk Score    Flowsheet Row ED to Hosp-Admission (Current) from 07/14/2022 in [facility]  30 Day Unplanned Readmission Risk Score (%) 8.67 Filed at 07/15/2022 1200       This score is the patient's risk of an unplanned readmission within 30 days of being discharged (0 -100%). The score is based on dignosis, age, lab data, medications, orders, and past utilization.   Low:  0-14.9   Medium: 15-21.9   High: 22-29.9   Extreme: 30 and above          Admitted From: Home Disposition: Home  Recommendations for Outpatient Follow-up:  Follow up with PCP in 1-2 weeks Please obtain BMP/CBC in one week Please follow up with your PCP on the following pending results: Unresulted Labs (From admission, onward)     Start     Ordered   07/21/22 0500  Creatinine, serum  (enoxaparin (LOVENOX)    CrCl >/= 30 ml/min)  Weekly,   R     Comments: while on enoxaparin therapy    07/14/22 2254              Home Health: None Equipment/Devices: None  Discharge Condition: Stable CODE STATUS: Full code Diet recommendation: Regular  Subjective: Seen and examined.  She feels well.  She has had only 2 bowel movements in last 12 hours, no abdominal pain or any other complaint.  No nausea.  She prefers to go home.  Brief/Interim Summary: This is a 42 year old female with no significant past medical history except for childbirth approximately a month ago.  In the postpartum.  Patient had broad-spectrum antibiotic for the UTI.  Approximately 4 days ago she developed diarrhea.  Patient was in the ER 6/15, diagnosed with C. difficile diarrhea.  She declined admission and CT abdomen.  She was discharged on p.o. vancomycin.  Per patient the next day she had severe diarrhea all day long along  with severe abdominal cramps and nausea so she returned to  [facility], this time CT abdomen and pelvis was done which showed signs of acute colitis involving ascending, transverse: As well as rectum and she was eventually admitted to hospitalist service.  She had very brief course of hospitalization.  Since admission which was almost 14 to 15 hours ago, patient has had only 2 bowel movements and per her, they have started to become semisoft and she has no abdominal pain or nausea.  She feels comfortable and prefers to go home.  She has been advised to resume vancomycin and complete 10 days as prescribed previously.  Per her request, I am prescribing her 5 tablets of oxycodone as needed for potential abdominal pain and as needed Zofran.  We had extensive discussion about what she needs to do to prevent transmitting the infection to her infant and other family members.  She had no further questions.   Discharge Diagnoses:  Principal Problem:   Clostridium difficile colitis    Discharge Instructions   Allergies as of 07/15/2022       Reactions   Gadolinium Derivatives Cough, Nausea Only   Other reaction(s): Diarrhea-Intolerance Itching in throat and coughing    Other    Other reaction(s): Diarrhea-Intolerance Itching in throat and coughing  gadolinium contrast   Tomato    Can do Raw but not cooked [place]  she will itch all over and get gas        Medication List     STOP taking these medications    clonazePAM 0.5 MG tablet Commonly known as: KLONOPIN   IRON PO   MILK THISTLE PO   naproxen 250 MG tablet Commonly known as: NAPROSYN   VITAMIN B-12 PO   VITAMIN C PO   VITAMIN K2-VITAMIN D3 PO   ZINC PO       TAKE these medications    ciprofloxacin 500 MG tablet Commonly known as: Cipro Take 1 tablet (500 mg total) by mouth 2 (two) times daily. One po bid x 7 days   norethindrone-ethinyl estradiol 1-20 MG-MCG tablet Commonly known as: LOESTRIN Take 1 tablet by  mouth daily.   ondansetron 4 MG disintegrating tablet Commonly known as: ZOFRAN-ODT Take 1 tablet (4 mg total) by mouth every 8 (eight) hours as needed for nausea or vomiting.   oxycodone 5 MG capsule Commonly known as: OXY-IR Take 1 capsule (5 mg total) by mouth every 4 (four) hours as needed.   pantoprazole 40 MG tablet Commonly known as: Protonix Take 1 tablet (40 mg total) by mouth daily.   vancomycin 125 MG capsule Commonly known as: VANCOCIN Take 1 capsule (125 mg total) by mouth 4 (four) times daily for 10 days.        Follow-up Information     PCP Follow up in 1 week(s).                 Allergies  Allergen Reactions   Gadolinium Derivatives Cough and Nausea Only    Other reaction(s): Diarrhea-Intolerance Itching in throat and coughing    Other     Other reaction(s): Diarrhea-Intolerance Itching in throat and coughing  gadolinium contrast   Tomato     Can do Raw but not cooked Said she will itch all over and get gas    Consultations: None   Procedures/Studies: CT ABDOMEN PELVIS W CONTRAST  Result Date: 07/14/2022 CLINICAL DATA:  Abdominal pain, C difficile infection EXAM: CT ABDOMEN AND PELVIS WITH CONTRAST TECHNIQUE: Multidetector CT imaging of the abdomen and pelvis was performed using the standard protocol following bolus administration of intravenous contrast. RADIATION DOSE REDUCTION: This exam was performed according to the departmental dose-optimization program which includes automated exposure control, adjustment of the mA and/or kV according to patient size and/or use of iterative reconstruction technique. CONTRAST:  80mL OMNIPAQUE IOHEXOL 300 MG/ML  SOLN COMPARISON:  CT abdomen pelvis dated 07/15/2019. FINDINGS: Lower chest: No acute abnormality. Hepatobiliary: A 6 mm hypoattenuating lesion in the right hepatic lobe is incompletely characterized. No gallstones, gallbladder wall thickening, or biliary dilatation. Pancreas: Unremarkable. No  pancreatic ductal dilatation or surrounding inflammatory changes. Spleen: Normal in size without focal abnormality. Adrenals/Urinary Tract: Adrenal glands are unremarkable. Bilateral nonobstructive renal calculi measure up to 3 mm on the right and 2 mm on the left. No focal renal lesion or hydronephrosis on either side. Bladder is unremarkable. Stomach/Bowel: Stomach is within normal limits. There is mild bowel wall thickening and mucosal hyperenhancement involving the rectum. There is circumferential bowel wall thickening and mucosal hyperenhancement involving the ascending and transverse colon. No significant surrounding inflammatory changes. Appendix appears normal. No evidence of bowel obstruction. Vascular/Lymphatic: No significant vascular findings are present. No enlarged abdominal or pelvic lymph nodes. Reproductive: Uterus and bilateral adnexa are unremarkable. Other: No abdominal wall hernia or abnormality. No abdominopelvic ascites. Musculoskeletal: No acute or significant osseous findings. IMPRESSION: 1. Bowel wall  thickening and mucosal hyperenhancement involving the ascending colon, transverse colon, and rectum is consistent with C difficile colitis. Electronically Signed   By: Romona Curls M.D.   On: 07/14/2022 19:25   DG Chest Portable 1 View  Result Date: 07/13/2022 CLINICAL DATA:  Chest wall pain for 1 week EXAM: PORTABLE CHEST 1 VIEW COMPARISON:  Chest x-ray 03/01/2020 FINDINGS: The heart size and mediastinal contours are within normal limits. Both lungs are clear. The visualized skeletal structures are unremarkable. IMPRESSION: No active disease. Electronically Signed   By: Darliss Cheney M.D.   On: 07/13/2022 18:17     Discharge Exam: Vitals:   07/15/22 0424 07/15/22 0808  BP: (!) 107/59 (!) 105/57  Pulse: (!) 56 (!) 43  Resp: 18 18  Temp: 98 F (36.7 C) 98 F (36.7 C)  SpO2: 99% 97%   Vitals:   07/14/22 2000 07/14/22 2206 07/15/22 0424 07/15/22 0808  BP: 101/60 98/77 (!)  107/59 (!) 105/57  Pulse: (!) 59 63 (!) 56 (!) 43  Resp:  18 18 18   Temp:  98 F (36.7 C) 98 F (36.7 C) 98 F (36.7 C)  TempSrc:  Oral    SpO2: 98% 100% 99% 97%  Weight:      Height:        General: Pt is alert, awake, not in acute distress Cardiovascular: RRR, S1/S2 +, no rubs, no gallops Respiratory: CTA bilaterally, no wheezing, no rhonchi Abdominal: Soft, NT, ND, bowel sounds + Extremities: no edema, no cyanosis    The results of significant diagnostics from this hospitalization (including imaging, microbiology, ancillary and laboratory) are listed below for reference.     Microbiology: Recent Results (from the past 240 hour(s))  Urine Culture     Status: None   Collection Time: 07/06/22  3:03 AM   Specimen: Urine, Clean Catch  Result Value Ref Range Status   Specimen Description   Final    URINE, CLEAN CATCH Performed at [facility], [address] [place], [place] [address]    Special Requests   Final    NONE Performed at [facility], [address] [place], [place] [address]    Culture   Final    NO GROWTH Performed at [facility], [address] [place], [place] [address]    Report Status 07/07/2022 FINAL  Final  C Difficile Quick Screen w PCR reflex     Status: Abnormal   Collection Time: 07/13/22  5:55 PM   Specimen: STOOL  Result Value Ref Range Status   C Diff antigen POSITIVE (A) NEGATIVE Final   C Diff toxin POSITIVE (A) NEGATIVE Final   C Diff interpretation Toxin producing C. difficile detected.  Final    Comment: CRITICAL RESULT CALLED TO, READ BACK BY AND VERIFIED WITH: POWELL RN 07/13/22 @ 2315 BY AB Performed at [facility], [address], [place], [place] [address]      Labs: BNP (last 3 results) No results for input(s): "BNP" in the last 8760 hours. Basic Metabolic Panel: Recent Labs  Lab 07/13/22 1721 07/14/22 1756 07/15/22 0055 07/15/22 0431  NA 139 135  --  140  K 3.6 3.6  --  3.6   CL 101 104  --  105  CO2 26 24  --  24  GLUCOSE 102* 110*  --  93  BUN 12 8  --  7  CREATININE 1.01* 0.80 0.91 0.90  CALCIUM 8.9 8.3*  --  8.4*  MG  --   --   --  1.7   Liver Function Tests: Recent Labs  Lab 07/13/22 1721 07/14/22 1756  AST 29 24  ALT 30 25  ALKPHOS 57 50  BILITOT 0.7 0.8  PROT 7.5 6.7  ALBUMIN 4.0 3.6   Recent Labs  Lab 07/13/22 1721 07/14/22 1756  LIPASE 49 37   No results for input(s): "AMMONIA" in the last 168 hours. CBC: Recent Labs  Lab 07/13/22 1721 07/14/22 1756 07/15/22 0055 07/15/22 0431  WBC 7.4 7.7 5.5 4.8  NEUTROABS  --  5.7  --  2.4  HGB 13.4 11.5* 10.7* 10.2*  HCT 41.0 34.6* 32.8* 31.5*  MCV 92.1 90.8 91.4 92.4  PLT 258 206 197 178   Cardiac Enzymes: No results for input(s): "CKTOTAL", "CKMB", "CKMBINDEX", "TROPONINI" in the last 168 hours. BNP: Invalid input(s): "POCBNP" CBG: No results for input(s): "GLUCAP" in the last 168 hours. D-Dimer No results for input(s): "DDIMER" in the last 72 hours. Hgb A1c No results for input(s): "HGBA1C" in the last 72 hours. Lipid Profile No results for input(s): "CHOL", "HDL", "LDLCALC", "TRIG", "CHOLHDL", "LDLDIRECT" in the last 72 hours. Thyroid function studies No results for input(s): "TSH", "T4TOTAL", "T3FREE", "THYROIDAB" in the last 72 hours.  Invalid input(s): "FREET3" Anemia work up No results for input(s): "VITAMINB12", "FOLATE", "FERRITIN", "TIBC", "IRON", "RETICCTPCT" in the last 72 hours. Urinalysis    Component Value Date/Time   COLORURINE YELLOW 07/13/2022 1720   APPEARANCEUR CLEAR 07/13/2022 1720   LABSPEC 1.020 07/13/2022 1720   PHURINE 5.5 07/13/2022 1720   GLUCOSEU NEGATIVE 07/13/2022 1720   HGBUR TRACE (A) 07/13/2022 1720   BILIRUBINUR NEGATIVE 07/13/2022 1720   KETONESUR NEGATIVE 07/13/2022 1720   PROTEINUR NEGATIVE 07/13/2022 1720   NITRITE NEGATIVE 07/13/2022 1720   LEUKOCYTESUR NEGATIVE 07/13/2022 1720   Sepsis Labs Recent Labs  Lab 07/13/22 1721  07/14/22 1756 07/15/22 0055 07/15/22 0431  WBC 7.4 7.7 5.5 4.8   Microbiology Recent Results (from the past 240 hour(s))  Urine Culture     Status: None   Collection Time: 07/06/22  3:03 AM   Specimen: Urine, Clean Catch  Result Value Ref Range Status   Specimen Description   Final    URINE, CLEAN CATCH Performed at [facility], [address] [place], [place] [address]    Special Requests   Final    NONE Performed at [facility], [address] [place], [place] [address]    Culture   Final    NO GROWTH Performed at [facility], [address] [place], [place] [address]    Report Status 07/07/2022 FINAL  Final  C Difficile Quick Screen w PCR reflex     Status: Abnormal   Collection Time: 07/13/22  5:55 PM   Specimen: STOOL  Result Value Ref Range Status   C Diff antigen POSITIVE (A) NEGATIVE Final   C Diff toxin POSITIVE (A) NEGATIVE Final   C Diff interpretation Toxin producing C. difficile detected.  Final    Comment: CRITICAL RESULT CALLED TO, READ BACK BY AND VERIFIED WITH: POWELL RN 07/13/22 @ 2315 BY AB Performed at [facility], [address] [place], [place] [address]     FURTHER DISCHARGE INSTRUCTIONS:   Get Medicines reviewed and adjusted: Please take all your medications with you for your next visit with your Primary MD   Laboratory/radiological data: Please request your Primary MD to go over all  hospital tests and procedure/radiological results at the follow up, please ask your Primary MD to get all Hospital records sent to his/her office.   In some cases, they will be blood work, cultures and biopsy results pending at the time of your discharge. Please request that your primary care M.D. goes through all the records of your hospital data and follows up on these results.   Also Note the following: If you experience worsening of your admission symptoms, develop shortness of breath, life threatening emergency,  suicidal or homicidal thoughts you must seek medical attention immediately by calling 911 or calling your MD immediately  if symptoms less severe.   You must read complete instructions/literature along with all the possible adverse reactions/side effects for all the Medicines you take and that have been prescribed to you. Take any new Medicines after you have completely understood and accpet all the possible adverse reactions/side effects.    Do not drive when taking Pain medications or sleeping medications (Benzodaizepines)   Do not take more than prescribed Pain, Sleep and Anxiety Medications. It is not advisable to combine anxiety,sleep and pain medications without talking with your primary care practitioner   Special Instructions: If you have smoked or chewed Tobacco  in the last 2 yrs please stop smoking, stop any regular Alcohol  and or any Recreational drug use.   Wear Seat belts while driving.   Please note: You were cared for by a hospitalist during your hospital stay. Once you are discharged, your primary care physician will handle any further medical issues. Please note that NO REFILLS for any discharge medications will be authorized once you are discharged, as it is imperative that you return to your primary care physician (or establish a relationship with a primary care physician if you do not have one) for your post hospital discharge needs so that they can reassess your need for medications and monitor your lab values  Time coordinating discharge: Over 30 minutes  SIGNED:   Hughie Closs, MD  [facility] 07/15/2022, 2:06 PM *Please note that this is a verbal dictation therefore any spelling or grammatical errors are due to the "Dragon [employer] One" system interpretation. If 7PM-7AM, please contact night-coverage [email or web address]

## 2022-07-15 NOTE — TOC Transition Note (Signed)
Transition of Care [facility]) - CM/SW Discharge Note   Patient Details  Name: Aimee Escobar MRN: [id number] Date of Birth: 1981/01/11  Transition of Care [facility]) CM/SW Contact:  Janae Bridgeman, RN Phone Number: 07/15/2022, 4:42 PM   Clinical Narrative:    CM met with the patient at the bedside prior to discharge to home.  The patient lives with her spouse at the home and is abreast feeding a 40 month old infant.  The patient sees [facility] in [place] and has a July appointment.  She would like to switch to a different provider and was agreeable to [facility] facility.  I called [facility] and they will place a call with to schedule an appointment in the next 1-2 weeks.  The clinic was provided with a contact number for the patient.   Final next level of care: Home/Self Care Barriers to Discharge: No Barriers Identified   Patient Goals and CMS Choice CMS Medicare.gov Compare Post Acute Care list provided to:: Patient Choice offered to / list presented to : Patient  Discharge Placement                         Discharge Plan and Services Additional resources added to the After Visit Summary for     Discharge Planning Services: CM Consult Post Acute Care Choice: Resumption of Svcs/PTA Provider                               Social Determinants of Health (SDOH) Interventions SDOH Screenings   Food Insecurity: No Food Insecurity (07/14/2022)  Housing: Low Risk  (07/14/2022)  Transportation Needs: No Transportation Needs (07/14/2022)  Utilities: Not At Risk (07/14/2022)  Tobacco Use: High Risk (07/14/2022)     Readmission Risk Interventions    07/15/2022    4:42 PM  Readmission Risk Prevention Plan  Post Dischage Appt Complete  Medication Screening Complete  Transportation Screening Complete

## 2022-07-17 LAB — OVA + PARASITE EXAM

## 2022-07-17 LAB — O&P RESULT

## 2022-07-23 NOTE — Telephone Encounter (Signed)
Spoke to pt. Will be at apt.  

## 2022-07-24 ENCOUNTER — Ambulatory Visit (INDEPENDENT_AMBULATORY_CARE_PROVIDER_SITE_OTHER): Payer: Managed Care, Other (non HMO) | Admitting: Primary Care

## 2022-08-05 ENCOUNTER — Telehealth (INDEPENDENT_AMBULATORY_CARE_PROVIDER_SITE_OTHER): Payer: Self-pay | Admitting: Pharmacist

## 2022-08-05 NOTE — Telephone Encounter (Signed)
Spoke to pt and she confirmed that she was going to be seen by another provider.

## 2022-08-06 ENCOUNTER — Ambulatory Visit (INDEPENDENT_AMBULATORY_CARE_PROVIDER_SITE_OTHER): Payer: Managed Care, Other (non HMO) | Admitting: Primary Care

## 2022-08-06 ENCOUNTER — Encounter (INDEPENDENT_AMBULATORY_CARE_PROVIDER_SITE_OTHER): Payer: Self-pay

## 2022-09-10 ENCOUNTER — Encounter: Payer: Self-pay | Admitting: Obstetrics & Gynecology

## 2022-09-11 ENCOUNTER — Other Ambulatory Visit: Payer: Self-pay | Admitting: Obstetrics & Gynecology

## 2022-09-11 DIAGNOSIS — Z1231 Encounter for screening mammogram for malignant neoplasm of breast: Secondary | ICD-10-CM

## 2022-10-09 ENCOUNTER — Ambulatory Visit: Payer: Managed Care, Other (non HMO)

## 2022-10-15 ENCOUNTER — Telehealth: Payer: Self-pay

## 2022-10-15 NOTE — Telephone Encounter (Signed)
Left voicemail for patient notifying her that Dr. Erin Fulling will be out of the office tomorrow afternoon and her appointment was changed to be with Dr. Jolayne Panther. Informed patient to call back if she would like to reschedule.

## 2022-10-16 ENCOUNTER — Ambulatory Visit: Payer: Managed Care, Other (non HMO) | Admitting: Obstetrics and Gynecology

## 2022-11-06 ENCOUNTER — Other Ambulatory Visit: Payer: Self-pay

## 2022-11-06 ENCOUNTER — Encounter (HOSPITAL_BASED_OUTPATIENT_CLINIC_OR_DEPARTMENT_OTHER): Payer: Self-pay | Admitting: Pediatrics

## 2022-11-06 ENCOUNTER — Emergency Department (HOSPITAL_BASED_OUTPATIENT_CLINIC_OR_DEPARTMENT_OTHER)
Admission: EM | Admit: 2022-11-06 | Discharge: 2022-11-06 | Disposition: A | Discharge status: Home / Self Care | Payer: Medicaid Other | Attending: Emergency Medicine | Admitting: Emergency Medicine

## 2022-11-06 ENCOUNTER — Emergency Department (HOSPITAL_BASED_OUTPATIENT_CLINIC_OR_DEPARTMENT_OTHER): Payer: Medicaid Other

## 2022-11-06 DIAGNOSIS — R079 Chest pain, unspecified: Secondary | ICD-10-CM | POA: Insufficient documentation

## 2022-11-06 LAB — CBC
HCT: 39.6 % (ref 36.0–46.0)
Hemoglobin: 13.2 g/dL (ref 12.0–15.0)
MCH: 30.1 pg (ref 26.0–34.0)
MCHC: 33.3 g/dL (ref 30.0–36.0)
MCV: 90.4 fL (ref 80.0–100.0)
Platelets: 280 10*3/uL (ref 150–400)
RBC: 4.38 MIL/uL (ref 3.87–5.11)
RDW: 13 % (ref 11.5–15.5)
WBC: 5 10*3/uL (ref 4.0–10.5)
nRBC: 0 % (ref 0.0–0.2)

## 2022-11-06 LAB — BASIC METABOLIC PANEL
Anion gap: 13 (ref 5–15)
BUN: 18 mg/dL (ref 6–20)
CO2: 22 mmol/L (ref 22–32)
Calcium: 9.5 mg/dL (ref 8.9–10.3)
Chloride: 104 mmol/L (ref 98–111)
Creatinine, Ser: 0.9 mg/dL (ref 0.44–1.00)
GFR, Estimated: >60 mL/min (ref >60)
Glucose, Bld: 118 mg/dL — ABNORMAL HIGH (ref 70–99)
Potassium: 3.8 mmol/L (ref 3.5–5.1)
Sodium: 139 mmol/L (ref 135–145)

## 2022-11-06 LAB — TROPONIN I (HIGH SENSITIVITY): Troponin I (High Sensitivity): <2 ng/L (ref <18)

## 2022-11-06 LAB — PREGNANCY, URINE: Preg Test, Ur: NEGATIVE

## 2022-11-06 LAB — D-DIMER, QUANTITATIVE: D-Dimer, Quant: 0.47 ug{FEU}/mL (ref 0.00–0.50)

## 2022-11-06 MED ORDER — ALUM & MAG HYDROXIDE-SIMETH 200-200-20 MG/5ML PO SUSP
30.0000 mL | Freq: Once | ORAL | Status: AC
  Administered 2022-11-06: 30 mL via ORAL
  Filled 2022-11-06: qty 30

## 2022-11-06 NOTE — ED Provider Notes (Signed)
[place] [facility] Provider Note   CSN: [id number] Arrival date & time: 11/06/22  1520     History  Chief Complaint  Patient presents with   Chest Pain    Aimee Escobar is a 42 y.o. female.  42 yo F with a chief complaints of chest pain.  This is left-sided and pinpoint.  Started couple days ago when she was running on a treadmill.  She said she had had a caffeinated beverage just before that and she felt her heart was racing at the same time.  Is never had a thing like this happen to her before.  She had been sick last week with cough and congestion.  She feels like that has quite a bit better.  She denies any abdominal pain denies nausea or vomiting.  Since a couple days ago it seemed to come and go.  Seems occur at random.  Not exertional.  Patient denies history of MI, denies hypertension hyperlipidemia diabetes or smoking.  Her father had an MI in his 10s.  Patient denies history of PE or DVT denies hemoptysis denies unilateral lower extremity edema denies recent surgery immobilization hospitalization or history of cancer.  The patient is on birth control.  She is also about 4 months postpartum and is breast-feeding.     Chest Pain      Home Medications Prior to Admission medications   Medication Sig Start Date End Date Taking? Authorizing Provider  ciprofloxacin (CIPRO) 500 MG tablet Take 1 tablet (500 mg total) by mouth 2 (two) times daily. One po bid x 7 days Patient not taking: Reported on 07/15/2022 07/06/22   Geoffery Lyons, MD  norethindrone-ethinyl estradiol (LOESTRIN) 1-20 MG-MCG tablet Take 1 tablet by mouth daily. Patient not taking: Reported on 07/15/2022 11/20/20   Willodean Rosenthal, MD  ondansetron (ZOFRAN-ODT) 4 MG disintegrating tablet Take 1 tablet (4 mg total) by mouth every 8 (eight) hours as needed for nausea or vomiting. 07/15/22   Hughie Closs, MD  oxycodone (OXY-IR) 5 MG capsule Take 1 capsule (5 mg total) by  mouth every 4 (four) hours as needed. 07/15/22   Hughie Closs, MD  pantoprazole (PROTONIX) 40 MG tablet Take 1 tablet (40 mg total) by mouth daily. 08/19/19 09/18/19  Lynann Bologna, MD  Prenat w/o A-FE-Methfol-FA-DHA (PNV-DHA) 27-0.6-0.4-300 MG CAPS Take 1 capsule by mouth daily.    [provider]      Allergies    Gadolinium derivatives and Iodinated contrast media    Review of Systems   Review of Systems  Cardiovascular:  Positive for chest pain.    Physical Exam Updated Vital Signs BP 108/70   Pulse 70   Temp 98.2 F (36.8 C)   Resp 12   Ht 5\' 5"  (1.651 m)   Wt 72.6 kg   SpO2 99%   BMI 26.63 kg/m  Physical Exam  ED Results / Procedures / Treatments   Labs (all labs ordered are listed, but only abnormal results are displayed) Labs Reviewed  CBC  PREGNANCY, URINE  D-DIMER, QUANTITATIVE  BASIC METABOLIC PANEL  TROPONIN I (HIGH SENSITIVITY)    EKG EKG Interpretation Date/Time:  Wednesday November 06 2022 15:28:41 EDT Ventricular Rate:  80 PR Interval:  132 QRS Duration:  92 QT Interval:  384 QTC Calculation: 443 R Axis:   40  Text Interpretation: Sinus rhythm No significant change since last tracing Confirmed by Melene Plan [id number]) on 11/06/2022 3:36:16 PM  Radiology No results found.  Procedures Procedures    Medications Ordered in ED Medications  alum & mag hydroxide-simeth (MAALOX/MYLANTA) 200-200-20 MG/5ML suspension 30 mL (30 mLs Oral Given 11/06/22 1608)    ED Course/ Medical Decision Making/ A&P                                 Medical Decision Making Amount and/or Complexity of Data Reviewed Labs: ordered. Radiology: ordered.  Risk OTC drugs.   42 yo F with a chief complaints of left-sided chest pain.  This is atypical in nature pinpoint seems to come and go at random.  Initially after she had had a caffeinated beverage and was running on the treadmill.  Since then has not been exertional.  She is on estrogen supplementation for  birth control.  Will obtain a D-dimer.  Blood work.  Give a dose of Maalox.  Reassess.  Patient's troponins negative, D-dimer negative.  No anemia.  Chest x-ray independently interpreted by me without focal infiltrate or pneumothorax.  Unfortunately our metabolic panel machine is broken and the lab has to be courier to offsite.  I discussed this limitation with the patient.  I think is unlikely to find a result there that would explain her chest discomfort.  She is otherwise healthy and not on any significant medications.  She would like to go home at this time as opposed to wait for her lab to result.  Will have her follow-up with her family doctor in the office.  4:25 PM:  I have discussed the diagnosis/risks/treatment options with the patient.  Evaluation and diagnostic testing in the emergency department does not suggest an emergent condition requiring admission or immediate intervention beyond what has been performed at this time.  They will follow up with PCP. We also discussed returning to the ED immediately if new or worsening sx occur. We discussed the sx which are most concerning (e.g., sudden worsening pain, fever, inability to tolerate by mouth) that necessitate immediate return. Medications administered to the patient during their visit and any new prescriptions provided to the patient are listed below.  Medications given during this visit Medications  alum & mag hydroxide-simeth (MAALOX/MYLANTA) 200-200-20 MG/5ML suspension 30 mL (30 mLs Oral Given 11/06/22 1608)     The patient appears reasonably screen and/or stabilized for discharge and I doubt any other medical condition or other [facility] requiring further screening, evaluation, or treatment in the ED at this time prior to discharge.            Final Clinical Impression(s) / ED Diagnoses Final diagnoses:  Nonspecific chest pain    Rx / DC Orders ED Discharge Orders     None         Melene Plan, DO 11/06/22 1625

## 2022-11-06 NOTE — ED Triage Notes (Signed)
C/O chest pain x 2 days, that comes and go in nature assoc with fast heart rate.

## 2022-11-06 NOTE — Discharge Instructions (Signed)
Try pepcid or tagamet up to twice a day.  Try to avoid things that may make this worse, most commonly these are spicy foods tomato based products fatty foods chocolate and peppermint.  Alcohol and tobacco can also make this worse.  Return to the emergency department for sudden worsening pain fever or inability to eat or drink.  

## 2022-11-13 ENCOUNTER — Ambulatory Visit: Payer: Managed Care, Other (non HMO) | Admitting: Obstetrics & Gynecology

## 2022-12-06 ENCOUNTER — Ambulatory Visit (INDEPENDENT_AMBULATORY_CARE_PROVIDER_SITE_OTHER): Payer: Medicaid Other | Admitting: Obstetrics and Gynecology

## 2022-12-06 VITALS — BP 116/79 | HR 70 | Ht 65.0 in | Wt 173.0 lb

## 2022-12-06 DIAGNOSIS — Z3009 Encounter for other general counseling and advice on contraception: Secondary | ICD-10-CM

## 2022-12-06 DIAGNOSIS — Z01419 Encounter for gynecological examination (general) (routine) without abnormal findings: Secondary | ICD-10-CM

## 2022-12-06 DIAGNOSIS — Z1231 Encounter for screening mammogram for malignant neoplasm of breast: Secondary | ICD-10-CM

## 2022-12-06 DIAGNOSIS — Z87898 Personal history of other specified conditions: Secondary | ICD-10-CM | POA: Diagnosis not present

## 2022-12-06 MED ORDER — SLYND 4 MG PO TABS: 1.0000 | ORAL_TABLET | Freq: Every day | ORAL | 3 refills | Status: AC

## 2022-12-06 NOTE — Progress Notes (Signed)
Patient presents for Annual.  LMP: No periods  Last pap: 11/21/21, Asc[facility]  Contraception: OCP Mammogram:  ordered  STD Screening: Declines  Flu Vaccine : Declines  CC:  Annual, breast concern regarding previous scan  , polyps

## 2022-12-06 NOTE — Progress Notes (Signed)
ANNUAL EXAM Patient name: Aimee Escobar MRN [id number]  Date of birth: [birth year] Chief Complaint:   Annual Exam  History of Present Illness:   Aimee Escobar is a 42 y.o. [phone] with No LMP recorded. (Menstrual status: Oral contraceptives). being seen today for a routine annual exam.  Current complaints:  - cervical polyp - possible abnormality of breast. Had BIRADS 4 lesion 02/2021 that was biopsied and benign. She recently obtained out of pocket/direct to [facility] and was told she needed a mammogram ASAP.  - needs refill on OCPs - was told the one she was on is only effective when breastfeeding  Last pap 11/21/21. Results were: ASCUS w/ HRHPV negative. Prior pap 2022 NILM/HPV negative Last mammogram: 03/2021. Results were: abnormal see above. Due for screening mammogram as of 12/2021 Last colonoscopy: never. Results were: N/A. Family h/o colorectal cancer: no     12/06/2022    9:32 AM  Depression screen PHQ 2/9  Decreased Interest 0  Down, Depressed, Hopeless 0  PHQ - 2 Score 0  Altered sleeping 0  Change in appetite 0  Feeling bad or failure about yourself  0  Trouble concentrating 0  Moving slowly or fidgety/restless 0  Suicidal thoughts 0  PHQ-9 Score 0  Difficult doing work/chores Not difficult at all      12/06/2022    9:33 AM  GAD 7 : Generalized Anxiety Score  Nervous, Anxious, on Edge 0  Control/stop worrying 0  Worry too much - different things 0  Trouble relaxing 0  Restless 0  Easily annoyed or irritable 0  Afraid - awful might happen 0  Total GAD 7 Score 0  Anxiety Difficulty Not difficult at all   Review of Systems:   Pertinent items are noted in HPI Denies any headaches, blurred vision, fatigue, shortness of breath, chest pain, abdominal pain, abnormal vaginal discharge/itching/odor/irritation, problems with periods, bowel movements, urination, or intercourse unless otherwise stated above. Pertinent History Reviewed:  Reviewed past  medical,surgical, social and family history.  Reviewed problem list, medications and allergies. Physical Assessment:   Vitals:   12/06/22 0920  BP: 116/79  Pulse: 70  Weight: 173 lb 0.6 oz (78.5 kg)  Height: 5\' 5"  (1.651 m)  Body mass index is 28.8 kg/m.        Physical Examination:   General appearance - well appearing, and in no distress  Mental status - alert, oriented to person, place, and time  Chest - respiratory effort normal  Heart - normal peripheral perfusion  Breasts - breasts appear normal, no suspicious masses, no skin or nipple changes or axillary nodes. Can feel clip (~69mm) beneath areola on left breast at 7-8 o'clock, c/w prior biopsy site.   Abdomen - soft, nontender, nondistended, no masses or organomegaly  Pelvic - VULVA: normal appearing vulva with no masses, tenderness or lesions  VAGINA: normal appearing vagina with normal color and discharge, no lesions  CERVIX: normal appearing cervix without discharge or lesions, no CMT  Chaperone present for exam  No results found for this or any previous visit (from the past 24 hour(s)).  Assessment & Plan:  1) Well-Woman Exam Mammogram: schedule screening mammo as soon as possible Colonoscopy: @ 42yo, or sooner if problems Pap: Due 2026 Declines flu today No cervical polyp seen on exam today  2) Contraception counseling - on mini pill but has trouble taking at the same time every day - discussed switching to COCs vs slynd, pt would prefer to avoid estrogen  since she has done well w/ POPs so far. Rx for slynd sent  3) History of abnormal mammogram - screening mammogram ordered (due as of 12/2021)  Labs/procedures today:  Orders Placed This Encounter  Procedures   MM 3D SCREENING MAMMOGRAM BILATERAL BREAST   Meds:  Meds ordered this encounter  Medications   Drospirenone (SLYND) 4 MG TABS    Sig: Take 1 tablet (4 mg total) by mouth daily.    Dispense:  90 tablet    Refill:  3   Follow-up: Return in about  1 year (around 12/06/2023) for annual exam.  Lennart Pall, MD 12/06/2022 3:50 PM

## 2022-12-06 NOTE — Patient Instructions (Signed)
It was nice meeting you today! You will be due for your next pap in 2 years.

## 2022-12-12 ENCOUNTER — Encounter (HOSPITAL_BASED_OUTPATIENT_CLINIC_OR_DEPARTMENT_OTHER): Payer: Self-pay

## 2022-12-12 ENCOUNTER — Ambulatory Visit (HOSPITAL_BASED_OUTPATIENT_CLINIC_OR_DEPARTMENT_OTHER)
Admission: RE | Admit: 2022-12-12 | Discharge: 2022-12-12 | Disposition: A | Discharge status: Home / Self Care | Payer: Managed Care, Other (non HMO) | Source: Ambulatory Visit | Attending: Obstetrics and Gynecology | Admitting: Obstetrics and Gynecology

## 2022-12-12 DIAGNOSIS — Z1231 Encounter for screening mammogram for malignant neoplasm of breast: Secondary | ICD-10-CM | POA: Diagnosis present

## 2022-12-16 ENCOUNTER — Other Ambulatory Visit: Payer: Self-pay | Admitting: Obstetrics and Gynecology

## 2022-12-16 DIAGNOSIS — R928 Other abnormal and inconclusive findings on diagnostic imaging of breast: Secondary | ICD-10-CM

## 2023-01-02 ENCOUNTER — Other Ambulatory Visit: Payer: Managed Care, Other (non HMO)

## 2023-01-16 ENCOUNTER — Ambulatory Visit
Admission: RE | Admit: 2023-01-16 | Discharge: 2023-01-16 | Disposition: A | Discharge status: Home / Self Care | Payer: Medicaid Other | Source: Ambulatory Visit | Attending: Obstetrics and Gynecology | Admitting: Obstetrics and Gynecology

## 2023-01-16 ENCOUNTER — Ambulatory Visit: Payer: Managed Care, Other (non HMO)

## 2023-01-16 DIAGNOSIS — R928 Other abnormal and inconclusive findings on diagnostic imaging of breast: Secondary | ICD-10-CM

## 2023-10-28 ENCOUNTER — Other Ambulatory Visit: Payer: Self-pay | Admitting: Obstetrics and Gynecology

## 2023-10-28 DIAGNOSIS — Z1231 Encounter for screening mammogram for malignant neoplasm of breast: Secondary | ICD-10-CM

## 2023-10-29 ENCOUNTER — Telehealth (HOSPITAL_BASED_OUTPATIENT_CLINIC_OR_DEPARTMENT_OTHER): Payer: Self-pay

## 2024-02-05 ENCOUNTER — Ambulatory Visit
